# Patient Record
Sex: Female | Born: 1965 | Race: Black or African American | Hispanic: No | Marital: Married | Smoking: Never smoker
Health system: Southern US, Community
[De-identification: ages and names within clinical notes are randomized; demographics above are authoritative.]

## PROBLEM LIST (undated history)

## (undated) DIAGNOSIS — E559 Vitamin D deficiency, unspecified: Secondary | ICD-10-CM

## (undated) DIAGNOSIS — K219 Gastro-esophageal reflux disease without esophagitis: Secondary | ICD-10-CM

## (undated) DIAGNOSIS — I509 Heart failure, unspecified: Secondary | ICD-10-CM

## (undated) DIAGNOSIS — Z803 Family history of malignant neoplasm of breast: Secondary | ICD-10-CM

## (undated) DIAGNOSIS — I10 Essential (primary) hypertension: Secondary | ICD-10-CM

## (undated) DIAGNOSIS — N939 Abnormal uterine and vaginal bleeding, unspecified: Secondary | ICD-10-CM

## (undated) DIAGNOSIS — M719 Bursopathy, unspecified: Secondary | ICD-10-CM

## (undated) DIAGNOSIS — Z9189 Other specified personal risk factors, not elsewhere classified: Secondary | ICD-10-CM

## (undated) HISTORY — DX: Heart failure, unspecified: I50.9

## (undated) HISTORY — DX: Family history of malignant neoplasm of breast: Z80.3

## (undated) HISTORY — DX: Abnormal uterine and vaginal bleeding, unspecified: N93.9

## (undated) HISTORY — DX: Vitamin D deficiency, unspecified: E55.9

## (undated) HISTORY — PX: CHOLECYSTECTOMY: SHX55

## (undated) HISTORY — DX: Bursopathy, unspecified: M71.9

## (undated) HISTORY — DX: Other specified personal risk factors, not elsewhere classified: Z91.89

## (undated) HISTORY — DX: Gastro-esophageal reflux disease without esophagitis: K21.9

## (undated) HISTORY — PX: TUBAL LIGATION: SHX77

## (undated) HISTORY — DX: Essential (primary) hypertension: I10

---

## 2001-02-27 DIAGNOSIS — I10 Essential (primary) hypertension: Secondary | ICD-10-CM

## 2001-02-27 DIAGNOSIS — I509 Heart failure, unspecified: Secondary | ICD-10-CM

## 2004-03-19 ENCOUNTER — Ambulatory Visit: Payer: Self-pay | Admitting: Internal Medicine

## 2004-04-21 ENCOUNTER — Ambulatory Visit: Payer: Self-pay

## 2004-04-24 ENCOUNTER — Ambulatory Visit: Payer: Self-pay

## 2004-05-07 ENCOUNTER — Ambulatory Visit: Payer: Self-pay | Admitting: Obstetrics & Gynecology

## 2007-10-19 ENCOUNTER — Other Ambulatory Visit: Payer: Self-pay

## 2007-10-19 ENCOUNTER — Emergency Department: Payer: Self-pay | Admitting: Emergency Medicine

## 2007-12-07 ENCOUNTER — Ambulatory Visit: Payer: Self-pay | Admitting: Internal Medicine

## 2009-05-16 ENCOUNTER — Ambulatory Visit: Payer: Self-pay

## 2009-12-04 ENCOUNTER — Ambulatory Visit: Payer: Self-pay

## 2012-04-19 DIAGNOSIS — Z9189 Other specified personal risk factors, not elsewhere classified: Secondary | ICD-10-CM

## 2012-04-19 DIAGNOSIS — Z803 Family history of malignant neoplasm of breast: Secondary | ICD-10-CM

## 2012-04-19 HISTORY — DX: Family history of malignant neoplasm of breast: Z80.3

## 2012-04-19 HISTORY — DX: Other specified personal risk factors, not elsewhere classified: Z91.89

## 2015-06-02 LAB — HM PAP SMEAR: HM PAP: NEGATIVE

## 2016-05-27 ENCOUNTER — Other Ambulatory Visit: Payer: Self-pay | Admitting: Internal Medicine

## 2016-06-01 ENCOUNTER — Encounter: Payer: Self-pay | Admitting: Advanced Practice Midwife

## 2016-06-01 ENCOUNTER — Encounter: Payer: 59 | Admitting: Advanced Practice Midwife

## 2016-06-01 ENCOUNTER — Ambulatory Visit: Payer: 59 | Admitting: Advanced Practice Midwife

## 2016-06-01 VITALS — BP 118/74 | Ht 63.0 in | Wt 148.0 lb

## 2016-06-01 DIAGNOSIS — N6323 Unspecified lump in the left breast, lower outer quadrant: Secondary | ICD-10-CM

## 2016-06-02 ENCOUNTER — Telehealth: Payer: Self-pay | Admitting: Advanced Practice Midwife

## 2016-06-02 NOTE — Telephone Encounter (Signed)
Patient is scheduled at [facility] for their first available appt on Friday, 06/11/16 @ 1:00pm. Patient said she has another appt the same day and time. Patient was given [place]'s phone# [phone] in case she wants to reschedule the [facility] appt instead of the other, and also to check for a sooner cancellation appt if she wants. Patient was also given my phone# and ext.

## 2016-06-02 NOTE — Progress Notes (Signed)
  HPI:      [name] is a 51 y.o. [id number] who is premenopausal, presents today for a problem visit.  She complains of breast tenderness and breast mass on the leftside which she first noticed a month ago.  It has not significantly changed.  Associated symptoms include none.  Denies nipple discharge or skin changes.  No fever.  Prior Mammogram: April 2017 negative Prior breast problems: No Family History: Breast Cancer-relatedfamily history includes Breast cancer in her mother.  PMHx: She  has a past medical history of Congestive heart failure (CHF) ([place]) and Hypertension. Also,  has a past surgical history that includes Cholecystectomy and Tubal ligation., family history includes Breast cancer in her mother; Diabetes in her mother; Hypertension in her mother; Osteoporosis in her father.,  reports that she has never smoked. She has never used smokeless tobacco. She reports that she does not drink alcohol or use drugs.  She has a current medication list which includes the following prescription(s): amlodipine-atorvastatin. Also, has No Known Allergies.  Review of Systems  Constitutional: Negative.   HENT: Negative.   Eyes: Negative.   Respiratory: Negative.   Cardiovascular: Negative.   Gastrointestinal: Negative.   Genitourinary: Negative.   Musculoskeletal: Negative.   Skin: Negative.   Neurological: Negative.   Endo/Heme/Allergies: Negative.   Psychiatric/Behavioral: Negative.     Objective: BP 118/74   Ht 5\' 3"  (1.6 m)   Wt 148 lb (67.1 kg)   LMP 05/31/2016   BMI 26.22 kg/m  Physical Exam  Constitutional: She is oriented to person, place, and time. She appears well-developed and well-nourished.  HENT:  Head: Normocephalic and atraumatic.  Neck: Normal range of motion. Neck supple.  Cardiovascular: Normal rate and regular rhythm.   Pulmonary/Chest: Effort normal and breath sounds normal.  Right breast without abnormal findings Left breast with tenderness at extreme  left lateral at 3:00. Very thin small ropy feeling mass approximately .5cm in length and 2/10cm width  Abdominal: Soft.  Musculoskeletal: Normal range of motion.  Neurological: She is alert and oriented to person, place, and time.    ASSESSMENT/PLAN: breast mass 1. Unspecified lump in the left breast, lower outer quadrant  - MM DIAG BREAST TOMO BILATERAL; Future - US BREAST LTD UNI LEFT INC AXILLA; Future - US BREAST LTD UNI RIGHT INC AXILLA; Future  Follow up as needed after diagnostic mammogram   Rod Can, CNM

## 2016-06-02 NOTE — Progress Notes (Signed)
This must be a duplicate encounter because I have finished the encounter with a diagnosis and a level of service on 4/24.

## 2016-06-07 ENCOUNTER — Other Ambulatory Visit: Payer: Self-pay | Admitting: *Deleted

## 2016-06-07 ENCOUNTER — Inpatient Hospital Stay
Admission: RE | Admit: 2016-06-07 | Discharge: 2016-06-07 | Disposition: A | Discharge status: Home / Self Care | Payer: Self-pay | Source: Ambulatory Visit | Attending: *Deleted | Admitting: *Deleted

## 2016-06-07 DIAGNOSIS — Z9289 Personal history of other medical treatment: Secondary | ICD-10-CM

## 2016-06-11 ENCOUNTER — Ambulatory Visit
Admission: RE | Admit: 2016-06-11 | Discharge: 2016-06-11 | Disposition: A | Discharge status: Home / Self Care | Payer: 59 | Source: Ambulatory Visit | Attending: Advanced Practice Midwife | Admitting: Advanced Practice Midwife

## 2016-06-11 DIAGNOSIS — N6323 Unspecified lump in the left breast, lower outer quadrant: Secondary | ICD-10-CM

## 2016-07-07 ENCOUNTER — Ambulatory Visit (INDEPENDENT_AMBULATORY_CARE_PROVIDER_SITE_OTHER): Payer: 59 | Admitting: Certified Nurse Midwife

## 2016-07-07 ENCOUNTER — Encounter: Payer: Self-pay | Admitting: Certified Nurse Midwife

## 2016-07-07 VITALS — BP 118/80 | HR 78 | Ht 63.0 in | Wt 144.0 lb

## 2016-07-07 DIAGNOSIS — Z803 Family history of malignant neoplasm of breast: Secondary | ICD-10-CM

## 2016-07-07 DIAGNOSIS — Z124 Encounter for screening for malignant neoplasm of cervix: Secondary | ICD-10-CM

## 2016-07-07 DIAGNOSIS — E559 Vitamin D deficiency, unspecified: Secondary | ICD-10-CM

## 2016-07-07 DIAGNOSIS — I10 Essential (primary) hypertension: Secondary | ICD-10-CM | POA: Insufficient documentation

## 2016-07-07 DIAGNOSIS — N951 Menopausal and female climacteric states: Secondary | ICD-10-CM

## 2016-07-07 DIAGNOSIS — Z01419 Encounter for gynecological examination (general) (routine) without abnormal findings: Secondary | ICD-10-CM

## 2016-07-07 NOTE — Progress Notes (Signed)
Gynecology Annual Exam  PCP: Dr Suzan Garibaldi Chief Complaint:  Chief Complaint  Patient presents with  . Gynecologic Exam    hot flashes    History of Present Illness:Krystal Evans is a 51 year old African American/Black female , G 2 P 2 0 0 2 , who presents for her annual exam . She is having irregular menses and hot flashes/ night sweats since May 2017. Menses are usually monthly, but last year skipped June, Sept, and October. Her menses this month is late. They normally last 6 days with light to moderate flow. Had an endometrial biopsy 2015 for metrorrhagia that returned secretory, and an ultrasound was suggestive of adenomyosis. She had normal FSH and LH at that time. Was seen in December of last year and treatment for hot flashes was discussed. Her LMP was 05/28/2016.    She reports dysmenorrhea on the first 2days of her menses and takes Motrin or Tylenol for the cramping with relief. The patient's past medical history is notable for a history of hypertension, vitamin D deficiency and a strong family hx of breast cancer.. Her PCP is Dr Suzan Garibaldi at [facility]  Since her last annual GYN exam dated 06/02/2015 , she has been having pain in her hips, particularly with abduction.  She is sexually active. She is currently using permanent sterilization (tubal) for contraception.  Her most recent pap smear was obtained 06/02/2015 and was with negative cells and negative HPV DNA.  Her most recent mammogram obtained on 06/11/2016 revealed no significant changes. She had presented to [facility] with complaints of left breast pain in the upper outer quadrant and a diagnostic mammogram was ordered. There is a positive history of breast cancer in her mother and paternal aunt . Genetic testing has not been done. Her lifetime risk is 22.29%. She has been offered genetic testing and declines.  There is no family history of ovarian cancer.  The patient does not do monthly self breast exams.  The patient  does not smoke.  The patient does not drink alcohol.  The patient does not use illegal drugs.  The patient has been having trouble exercising  Regularly due to her hip pain. The patient does not get adequate calcium in her diet. She is lactose intolerant. She has been off and on vitamin D2 50,000 IU weekly (it bothers her stomach) She had a recent cholesterol screen done yesterday at her PCP and results are pending.   The patient denies current symptoms of depression.    Review of Systems: Review of Systems  Constitutional: Negative for chills, fever and weight loss.  HENT: Negative for congestion, sinus pain and sore throat.   Eyes: Negative for blurred vision and pain.  Respiratory: Negative for hemoptysis, shortness of breath and wheezing.   Cardiovascular: Negative for chest pain, palpitations and leg swelling.  Gastrointestinal: Negative for abdominal pain, blood in stool, diarrhea, heartburn, nausea and vomiting.  Genitourinary: Negative for dysuria, frequency, hematuria and urgency.       Positive for irregular menses  Musculoskeletal: Positive for joint pain (hip). Negative for back pain and myalgias.  Skin: Negative for itching and rash.  Neurological: Negative for dizziness, tingling and headaches.  Endo/Heme/Allergies: Negative for environmental allergies and polydipsia. Does not bruise/bleed easily.       Negative for hirsutism. Positive for hot flashes   Psychiatric/Behavioral: Negative for depression. The patient is not nervous/anxious and does not have insomnia.     Past Medical History:  Past Medical History:  Diagnosis Date  . Abnormal uterine bleeding   . Congestive heart failure (CHF) ([place])   . Family history of breast cancer 04/19/2012   IBIS 22.29%  . GERD (gastroesophageal reflux disease)   . Hypertension   . Increased risk of breast cancer 04/19/2012   IBIS 22.29  . Vitamin D deficiency     Past Surgical History:  Past Surgical History:  Procedure  Laterality Date  . CHOLECYSTECTOMY    . TUBAL LIGATION      Family History:  Family History  Problem Relation Age of Onset  . Diabetes Mother   . Hypertension Mother   . Breast cancer Mother        55  . Osteoporosis Mother   . Glaucoma Mother   . Osteoporosis Father   . Heart disease Father   . Hypertension Father   . Hypertension Brother   . Diabetes Maternal Aunt   . Breast cancer Paternal Aunt 62       again at 47  . Diabetes Paternal Aunt     Social History:  Social History   Social History  . Marital status: Married    Spouse name: N/A  . Number of children: 2  . 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 Smoking status: Never Smoker  . Smokeless tobacco: Never Used  . Alcohol use No  . Drug use: No  . Sexual activity: Yes    Birth control/ protection: Surgical   Other Topics Concern  . Not on file   Social History Narrative  . No narrative on file    Allergies:  No Known Allergies  Medications: Prior to Admission medications   Medication Sig Start Date End Date Taking? Authorizing Provider  amLODipine-benazepril (LOTREL) 10-20 MG capsule Take 1 capsule by mouth daily.   Yes [provider]    Physical Exam Vitals: Blood pressure 118/80, pulse 78, height 5\' 3"  (1.6 m), weight 144 lb (65.3 kg), BMI 25.51 kg/m2, last menstrual period 05/28/2016.  General: pleasant Black female in NAD HEENT: normocephalic, anicteric Neck: no thyroid enlargement, no palpable nodules, no cervical lymphadenopathy  Pulmonary: No increased work of breathing, CTAB Cardiovascular: RRR, without murmur  Breast: deferred exam due to recent exam and mammogram.  No axillary, infraclavicular or supraclavicular lymphadenopathy Abdomen: Soft, non-tender, non-distended.  Umbilicus without lesions.  No hepatomegaly or masses palpable. No evidence of hernia. Genitourinary:  External: Normal external female genitalia.   Normal urethral meatus, normal Bartholin's and Skene's glands.    Vagina: Normal vaginal mucosa, no evidence of prolapse.    Cervix: Grossly normal in appearance, no bleeding, non-tender  Uterus: Retroflexed, normal size, shape, and consistency; decreased mobility, and non-tender  Adnexa: No adnexal masses, non-tender  Rectal: deferred  Lymphatic: no evidence of inguinal lymphadenopathy Extremities: no edema, erythema, or tenderness Neurologic: Grossly intact Psychiatric: mood appropriate, affect full     Assessment: 51 y.o. [id number] well woman exam Perimenopausal bleeding and vasomotor symptoms  Plan:   1) Breast cancer screening - recommend monthly self breast exam and annual mammograms. Mammogram is up to date.  2) Colon cancer screening: discussed methods of colon cancer screening, starting with next annual since she is turning 52 in November.  3) Cervical cancer screening - Pap was done. ASCCP guidelines and rational discussed.  Patient opts for yearly screening interval  4) Routine healthcare maintenance including cholesterol and diabetes screening managed by  PCP   5) Recommend going to orthopedics for further evaluation of hip pain.  6) Discussed vasomotor symptoms and irregular menses leading up to menopause. Not interested in any treatment of vasomotor symptoms at this time.  7) Osteoporosis prevention: discussed calcium and vitamin D requirements (recommend taking 1000-2000IU vitamin D3 daily)/ exercise  Dalia Heading, CNM

## 2016-07-09 LAB — IGP,RFX APTIMA HPV ALL PTH: PAP SMEAR COMMENT: 0

## 2016-07-12 NOTE — Progress Notes (Signed)
This encounter was created in error - please disregard.

## 2017-02-04 ENCOUNTER — Ambulatory Visit (INDEPENDENT_AMBULATORY_CARE_PROVIDER_SITE_OTHER): Payer: Managed Care, Other (non HMO) | Admitting: Nurse Practitioner

## 2017-02-04 ENCOUNTER — Encounter: Payer: Self-pay | Admitting: Nurse Practitioner

## 2017-02-04 VITALS — BP 132/91 | HR 89 | Resp 16 | Ht 63.0 in | Wt 145.4 lb

## 2017-02-04 DIAGNOSIS — M7071 Other bursitis of hip, right hip: Secondary | ICD-10-CM

## 2017-02-04 DIAGNOSIS — I10 Essential (primary) hypertension: Secondary | ICD-10-CM

## 2017-02-04 DIAGNOSIS — M25511 Pain in right shoulder: Secondary | ICD-10-CM | POA: Diagnosis not present

## 2017-02-04 DIAGNOSIS — R002 Palpitations: Secondary | ICD-10-CM | POA: Insufficient documentation

## 2017-02-04 DIAGNOSIS — M791 Myalgia, unspecified site: Secondary | ICD-10-CM | POA: Diagnosis not present

## 2017-02-04 DIAGNOSIS — K219 Gastro-esophageal reflux disease without esophagitis: Secondary | ICD-10-CM | POA: Insufficient documentation

## 2017-02-04 MED ORDER — TIZANIDINE HCL 4 MG PO TABS: 4.0000 mg | ORAL_TABLET | Freq: Four times a day (QID) | ORAL | 0 refills | Status: DC | PRN

## 2017-02-04 MED ORDER — METHYLPREDNISOLONE 4 MG PO TABS: ORAL_TABLET | ORAL | 0 refills | Status: DC

## 2017-02-04 MED ORDER — ETODOLAC 400 MG PO TABS: 400.0000 mg | ORAL_TABLET | Freq: Two times a day (BID) | ORAL | 1 refills | Status: DC

## 2017-02-04 NOTE — Patient Instructions (Addendum)
Hip Bursitis Hip bursitis is swelling of a fluid-filled sac (bursa) in your hip. This swelling (inflammation) can be painful. This condition may come and go over time. Follow these instructions at home: Medicines  Take over-the-counter and prescription medicines only as told by your doctor.  Do not drive or use heavy machinery while taking prescription pain medicine, or as told by your doctor.  If you were prescribed an antibiotic medicine, take it as told by your doctor. Do not stop taking the antibiotic even if you start to feel better. Activity  Return to your normal activities as told by your doctor. Ask your doctor what activities are safe for you.  Rest and protect your hip until you feel better. General instructions  Wear wraps that put pressure on your hip (compression wraps) only as told by your doctor.  Raise (elevate) your hip above the level of your heart as much as you can. To do this, try putting a pillow under your hips while you lie down. Stop if this causes pain.  Do not use your hip to support your body weight until your doctor says that you can.  Use crutches as told by your doctor.  Gently rub and stretch your injured area as often as is comfortable.  Keep all follow-up visits as told by your doctor. This is important. How is this prevented?  Exercise regularly, as told by your doctor.  Warm up and stretch before being active.  Cool down and stretch after being active.  Avoid activities that bother your hip or cause pain.  Avoid sitting down for long periods at a time. Contact a doctor if:  You have a fever.  You get new symptoms.  You have trouble walking.  You have trouble doing everyday activities.  You have pain that gets worse.  You have pain that does not get better with medicine.  You get red skin on your hip area.  You get a feeling of warmth in your hip area. Get help right away if:  You cannot move your hip.  You have very bad  pain. This information is not intended to replace advice given to you by your health care provider. Make sure you discuss any questions you have with your health care provider. Document Released: 02/27/2010 Document Revised: 07/03/2015 Document Reviewed: 08/27/2014 Elsevier Interactive Patient Education  2018 Elsevier Inc.  

## 2017-02-04 NOTE — Progress Notes (Signed)
Subjective:     Patient ID: Krystal Evans, female   DOB: 08-22-1965, 51 y.o.   MRN: [id number]  The patient is here for routine follow up of blood pressure. Diastolic pressure is a little elevated today, but she has been etaing foods a little higher in salt the past several days.  She is c/o tightness and pain in right hip, off and on, for a while. She also had some right shoulder pain which has developed over past few weeks .She denies injury or trauma to the joints. Does not believe she is of has done anything different to cause inflammation to these areas .    Current Outpatient Medications:  .  amLODipine (NORVASC) 10 MG tablet, Take 10 mg by mouth daily., Disp: , Rfl:  .  losartan (COZAAR) 25 MG tablet, Take 25 mg by mouth daily., Disp: , Rfl:  .  etodolac (LODINE) 400 MG tablet, Take 1 tablet (400 mg total) by mouth 2 (two) times daily., Disp: 60 tablet, Rfl: 1 .  methylPREDNISolone (MEDROL) 4 MG tablet, 6 day dose pack. Take by mouth as directed, Disp: 21 tablet, Rfl: 0 .  tiZANidine (ZANAFLEX) 4 MG tablet, Take 1 tablet (4 mg total) by mouth every 6 (six) hours as needed for muscle spasms., Disp: 30 tablet, Rfl: 0  Review of Systems  Constitutional: Negative for appetite change.  HENT: Negative.   Cardiovascular: Negative for chest pain and palpitations.  Gastrointestinal: Positive for abdominal pain, constipation, diarrhea and nausea.  Endocrine: Negative.   Musculoskeletal: Positive for arthralgias and myalgias.  Skin: Negative.   Allergic/Immunologic: Negative.   Neurological: Negative.  Negative for light-headedness and headaches.  Hematological: Negative.   Psychiatric/Behavioral: Negative.    Today's Vitals   02/04/17 1106  BP: (!) 132/91  Pulse: 89  Resp: 16  SpO2: 99%  Weight: 145 lb 6.4 oz (66 kg)  Height: 5\' 3"  (1.6 m)      Objective:   Physical Exam  Constitutional: She is oriented to person, place, and time. She appears well-developed and well-nourished.    HENT:  Head: Normocephalic.  Eyes: Pupils are equal, round, and reactive to light.  Neck: Normal range of motion. Neck supple.  Cardiovascular: Normal rate, regular rhythm and normal heart sounds.  Pulmonary/Chest: Effort normal and breath sounds normal.  Abdominal: Soft. There is no tenderness.  Musculoskeletal:       Arms:      Legs: Neurological: She is alert and oriented to person, place, and time.  Skin: Skin is warm and dry.  Psychiatric: She has a normal mood and affect.  Nursing note and vitals reviewed.      Assessment:     Bursitis of right hip, unspecified bursa - Plan: methylPREDNISolone (MEDROL) 4 MG tablet, etodolac (LODINE) 400 MG tablet  Pain in shoulder region, right  Myalgia - Plan: tiZANidine (ZANAFLEX) 4 MG tablet  Essential hypertension  Gastro-esophageal reflux disease without esophagitis     Plan:     1. Right hip bursitis - etodolac 400mg  bid as needed. Medrol dose pack. Take as directed for 6 days. Will x-ray if no improvement after initial 6 day treatment 2. Right shoulder pain. NSAID as needed and as prescribed. Added tizanidine 4mg , taking 1/2 to 1 tablet at bedtime if needed for muscle pain/tightness. ROM exercises recommended 3. bp stable - continue bp medicatons as prescribed   Follow up 6 months and sooner if needed

## 2017-05-31 ENCOUNTER — Ambulatory Visit: Payer: Self-pay | Admitting: Internal Medicine

## 2017-06-03 ENCOUNTER — Other Ambulatory Visit: Payer: Self-pay | Admitting: Nurse Practitioner

## 2017-06-03 MED ORDER — OLMESARTAN MEDOXOMIL 5 MG PO TABS: 5.0000 mg | ORAL_TABLET | Freq: Every day | ORAL | 2 refills | Status: DC

## 2017-08-04 ENCOUNTER — Encounter: Payer: Self-pay | Admitting: Nurse Practitioner

## 2017-08-04 ENCOUNTER — Ambulatory Visit (INDEPENDENT_AMBULATORY_CARE_PROVIDER_SITE_OTHER): Payer: Managed Care, Other (non HMO) | Admitting: Nurse Practitioner

## 2017-08-04 VITALS — BP 151/100 | HR 63 | Resp 16 | Ht 63.0 in | Wt 152.2 lb

## 2017-08-04 DIAGNOSIS — M7071 Other bursitis of hip, right hip: Secondary | ICD-10-CM

## 2017-08-04 DIAGNOSIS — M791 Myalgia, unspecified site: Secondary | ICD-10-CM

## 2017-08-04 DIAGNOSIS — I10 Essential (primary) hypertension: Secondary | ICD-10-CM | POA: Diagnosis not present

## 2017-08-04 MED ORDER — LOSARTAN POTASSIUM 25 MG PO TABS: 25.0000 mg | ORAL_TABLET | Freq: Every day | ORAL | 1 refills | Status: DC

## 2017-08-04 MED ORDER — AMLODIPINE BESYLATE 10 MG PO TABS: 10.0000 mg | ORAL_TABLET | Freq: Every day | ORAL | 1 refills | Status: DC

## 2017-08-04 NOTE — Progress Notes (Signed)
[facility] [place], [place] [address]  Internal MEDICINE  Office Visit Note  Patient Name: Krystal Evans  [id number]  [id number]  Date of Service: 08/28/2017   Pt is here for routine follow up.   Chief Complaint  Patient presents with  . Hypertension    Hypertension  This is a chronic problem. The current episode started more than 1 year ago. The problem has been gradually worsening since onset. The problem is resistant. Associated symptoms include headaches. Pertinent negatives include no chest pain, palpitations or shortness of breath. Agents associated with hypertension include NSAIDs. Risk factors for coronary artery disease include post-menopausal state and stress. Past treatments include ACE inhibitors and calcium channel blockers. The current treatment provides moderate improvement. There are no compliance problems.        Current Medication: Outpatient Encounter Medications as of 08/04/2017  Medication Sig  . amLODipine (NORVASC) 10 MG tablet Take 1 tablet (10 mg total) by mouth daily.  . [DISCONTINUED] amLODipine (NORVASC) 10 MG tablet Take 10 mg by mouth daily.  Marland Kitchen etodolac (LODINE) 400 MG tablet Take 1 tablet (400 mg total) by mouth 2 (two) times daily. (Patient not taking: Reported on 08/04/2017)  . losartan (COZAAR) 25 MG tablet Take 1 tablet (25 mg total) by mouth daily.  . methylPREDNISolone (MEDROL) 4 MG tablet 6 day dose pack. Take by mouth as directed (Patient not taking: Reported on 08/04/2017)  . olmesartan (BENICAR) 5 MG tablet Take 1 tablet (5 mg total) by mouth daily. (Patient not taking: Reported on 08/04/2017)  . tiZANidine (ZANAFLEX) 4 MG tablet Take 1 tablet (4 mg total) by mouth every 6 (six) hours as needed for muscle spasms. (Patient not taking: Reported on 08/04/2017)  . [DISCONTINUED] losartan (COZAAR) 25 MG tablet Take 25 mg by mouth daily.   No facility-administered encounter medications on file as of 08/04/2017.     Surgical  History: Past Surgical History:  Procedure Laterality Date  . CHOLECYSTECTOMY    . TUBAL LIGATION      Medical History: Past Medical History:  Diagnosis Date  . Abnormal uterine bleeding   . Bursitis   . Congestive heart failure (CHF) ([place])   . Family history of breast cancer 04/19/2012   IBIS 22.29%  . GERD (gastroesophageal reflux disease)   . Hypertension   . Increased risk of breast cancer 04/19/2012   IBIS 22.29  . Vitamin D deficiency     Family History: Family History  Problem Relation Age of Onset  . Diabetes Mother   . Hypertension Mother   . Breast cancer Mother        28  . Osteoporosis Mother   . Glaucoma Mother   . Osteoporosis Father   . Heart disease Father   . Hypertension Father   . Hypertension Brother   . Diabetes Maternal Aunt   . Breast cancer Paternal Aunt 13       again at 74  . Diabetes Paternal Aunt     Social History   Socioeconomic History  . Marital status: Married    Spouse name: Not on file  . Number of children: 2  . Years of education: Not on file  . Highest education level: Not on file  Occupational History  . Occupation: A/R rep  Social Needs  . Financial resource strain: Not on file  . Food insecurity:    Worry: Not on file    Inability: Not on file  . Transportation needs:  Medical: Not on file    Non-medical: Not on file  Tobacco Use  . Smoking status: Never Smoker  . Smokeless tobacco: Never Used  Substance and Sexual Activity  . Alcohol use: No  . Drug use: No  . Sexual activity: Yes    Birth control/protection: Surgical  Lifestyle  . Physical activity:    Days per week: Not on file    Minutes per session: Not on file  . Stress: Not on file  Relationships  . Social connections:    Talks on phone: Not on file    Gets together: Not on file    Attends religious service: Not on file    Active member of club or organization: Not on file    Attends meetings of clubs or organizations: Not on file     Relationship status: Not on file  . Intimate partner violence:    Fear of current or ex partner: Not on file    Emotionally abused: Not on file    Physically abused: Not on file    Forced sexual activity: Not on file  Other Topics Concern  . Not on file  Social History Narrative  . Not on file      Review of Systems  Constitutional: Negative for appetite change, fatigue and unexpected weight change.  HENT: Negative for nosebleeds, postnasal drip, rhinorrhea, sinus pressure, sinus pain, sore throat and voice change.   Eyes: Negative.   Respiratory: Negative for shortness of breath and wheezing.   Cardiovascular: Negative for chest pain and palpitations.       Elevated blood pressure.   Gastrointestinal: Positive for nausea. Negative for abdominal pain, constipation and diarrhea.  Endocrine: Negative for cold intolerance, heat intolerance, polydipsia, polyphagia and polyuria.  Musculoskeletal: Positive for arthralgias and myalgias.       Improved hip pain.  Skin: Negative for rash.  Allergic/Immunologic: Negative.  Negative for environmental allergies.  Neurological: Positive for headaches. Negative for light-headedness.  Hematological: Negative for adenopathy.  Psychiatric/Behavioral: Negative for dysphoric mood and sleep disturbance. The patient is not nervous/anxious.     Vital Signs: BP (!) 151/100 (BP Location: Right Arm, Patient Position: Sitting, Cuff Size: Normal) Comment: pt out of one of the two bp pills that she taking  Pulse 63   Resp 16   Ht 5\' 3"  (1.6 m)   Wt 152 lb 3.2 oz (69 kg)   LMP 07/19/2017 (Exact Date)   SpO2 97%   BMI 26.96 kg/m    Physical Exam  Constitutional: She is oriented to person, place, and time. She appears well-developed and well-nourished. No distress.  HENT:  Head: Normocephalic and atraumatic.  Nose: Nose normal.  Mouth/Throat: Oropharynx is clear and moist. No oropharyngeal exudate.  Eyes: Pupils are equal, round, and reactive to  light. Conjunctivae and EOM are normal.  Neck: Normal range of motion. Neck supple. No JVD present. Carotid bruit is not present. No tracheal deviation present. No thyromegaly present.  Cardiovascular: Normal rate, regular rhythm and normal heart sounds. Exam reveals no gallop and no friction rub.  No murmur heard. Pulmonary/Chest: Effort normal and breath sounds normal. No respiratory distress. She has no wheezes. She has no rales. She exhibits no tenderness.  Abdominal: Soft. Bowel sounds are normal. There is no tenderness.  Musculoskeletal: Normal range of motion.  Lymphadenopathy:    She has no cervical adenopathy.  Neurological: She is alert and oriented to person, place, and time. No cranial nerve deficit.  Skin: Skin is warm  and dry. Capillary refill takes less than 2 seconds. She is not diaphoretic.  Psychiatric: She has a normal mood and affect. Her behavior is normal. Judgment and thought content normal.  Nursing note and vitals reviewed.  Assessment/Plan: 1. Essential hypertension Adjust blood pressure medications to norvasc 10mg  and losartan 25mg  daily. Reviewed and recommended DASH diet. Increase water intake. - amLODipine (NORVASC) 10 MG tablet; Take 1 tablet (10 mg total) by mouth daily.  Dispense: 30 tablet; Refill: 1 - losartan (COZAAR) 25 MG tablet; Take 1 tablet (25 mg total) by mouth daily.  Dispense: 30 tablet; Refill: 1  2. Bursitis of right hip, unspecified bursa Improved. Use etodolac as needed and as prescribed. Orthopedics follow up as needed.   3. Myalgia May continue muscle relaxer as needed and as prescribed.   General Counseling: Krystal Evans understanding of the findings of todays visit and agrees with plan of treatment. I have discussed any further diagnostic evaluation that may be needed or ordered today. We also reviewed her medications today. she has been encouraged to call the office with any questions or concerns that should arise related to todays  visit.    Counseling:  Hypertension Counseling:   The following hypertensive lifestyle modification were recommended and discussed:  1. Limiting alcohol intake to less than 1 oz/day of ethanol:(24 oz of beer or 8 oz of wine or 2 oz of 100-proof whiskey). 2. Take baby ASA 81 mg daily. 3. Importance of regular aerobic exercise and losing weight. 4. Reduce dietary saturated fat and cholesterol intake for overall cardiovascular health. 5. Maintaining adequate dietary potassium, calcium, and magnesium intake. 6. Regular monitoring of the blood pressure. 7. Reduce sodium intake to less than 100 mmol/day (less than 2.3 gm of sodium or less than 6 gm of sodium choride)   This patient was seen by [place] with Dr Lavera Guise as a part of collaborative care agreement   Meds ordered this encounter  Medications  . amLODipine (NORVASC) 10 MG tablet    Sig: Take 1 tablet (10 mg total) by mouth daily.    Dispense:  30 tablet    Refill:  1    Order Specific Question:   Supervising Provider    Answer:   Lavera Guise [[id number]]  . losartan (COZAAR) 25 MG tablet    Sig: Take 1 tablet (25 mg total) by mouth daily.    Dispense:  30 tablet    Refill:  1    Order Specific Question:   Supervising Provider    Answer:   Lavera Guise [[id number]]    Time spent: 35 Minutes     Dr Lavera Guise Internal medicine

## 2017-08-17 ENCOUNTER — Telehealth: Payer: Self-pay

## 2017-08-17 ENCOUNTER — Encounter: Payer: Self-pay | Admitting: Emergency Medicine

## 2017-08-17 ENCOUNTER — Emergency Department: Payer: Managed Care, Other (non HMO)

## 2017-08-17 ENCOUNTER — Emergency Department
Admission: EM | Admit: 2017-08-17 | Discharge: 2017-08-17 | Disposition: A | Discharge status: Home / Self Care | Payer: Managed Care, Other (non HMO) | Attending: Emergency Medicine | Admitting: Emergency Medicine

## 2017-08-17 DIAGNOSIS — I110 Hypertensive heart disease with heart failure: Secondary | ICD-10-CM | POA: Insufficient documentation

## 2017-08-17 DIAGNOSIS — Z79899 Other long term (current) drug therapy: Secondary | ICD-10-CM | POA: Diagnosis not present

## 2017-08-17 DIAGNOSIS — M542 Cervicalgia: Secondary | ICD-10-CM | POA: Diagnosis present

## 2017-08-17 DIAGNOSIS — M5412 Radiculopathy, cervical region: Secondary | ICD-10-CM | POA: Diagnosis not present

## 2017-08-17 DIAGNOSIS — I509 Heart failure, unspecified: Secondary | ICD-10-CM | POA: Diagnosis not present

## 2017-08-17 LAB — BASIC METABOLIC PANEL
Anion gap: 7 (ref 5–15)
BUN: 9 mg/dL (ref 6–20)
CO2: 28 mmol/L (ref 22–32)
Calcium: 9.4 mg/dL (ref 8.9–10.3)
Chloride: 105 mmol/L (ref 98–111)
Creatinine, Ser: 0.83 mg/dL (ref 0.44–1.00)
Glucose, Bld: 107 mg/dL — ABNORMAL HIGH (ref 70–99)
POTASSIUM: 3.6 mmol/L (ref 3.5–5.1)
SODIUM: 140 mmol/L (ref 135–145)

## 2017-08-17 LAB — CBC
HEMATOCRIT: 36.9 % (ref 35.0–47.0)
Hemoglobin: 12.6 g/dL (ref 12.0–16.0)
MCH: 30.6 pg (ref 26.0–34.0)
MCHC: 34 g/dL (ref 32.0–36.0)
MCV: 89.9 fL (ref 80.0–100.0)
PLATELETS: 264 10*3/uL (ref 150–440)
RBC: 4.1 MIL/uL (ref 3.80–5.20)
RDW: 13.6 % (ref 11.5–14.5)
WBC: 6.6 10*3/uL (ref 3.6–11.0)

## 2017-08-17 LAB — TROPONIN I: Troponin I: <0.03 ng/mL (ref <0.03)

## 2017-08-17 MED ORDER — GABAPENTIN 100 MG PO CAPS: 100.0000 mg | ORAL_CAPSULE | Freq: Three times a day (TID) | ORAL | 0 refills | Status: DC

## 2017-08-17 NOTE — Telephone Encounter (Signed)
Pt called having pain her left arm ,tingling and numbness and palpitations as per heather advised pt go to ER

## 2017-08-17 NOTE — ED Notes (Signed)
Pt has pain and numbness in left side of neck and arm.  No chest pain. Sx for 2-3 days.  Pt alert.   Speech clear. Marland Kitchen

## 2017-08-17 NOTE — ED Provider Notes (Signed)
[facility] Emergency Department Provider Note  ____________________________________________   First MD Initiated Contact with Patient 08/17/17 1723     (approximate)  I have reviewed the triage vital signs and the nursing notes.   HISTORY  Chief Complaint Arm Pain   HPI Krystal Evans is a 52 y.o. female who self presents to the emergency department with 2 weeks of left neck and left shoulder pain.  The pain radiates from her left lateral neck towards the ulnar aspect of her left arm towards the fingertip.  No weakness.  She does not drop things.  No trauma.  No history of the same.  She denies chest pain or shortness of breath.  She denies color change or coldness in her left hand.  The pain is worse when ranging her arm and somewhat improved with keeping still.    Past Medical History:  Diagnosis Date  . Abnormal uterine bleeding   . Bursitis   . Congestive heart failure (CHF) ([place])   . Family history of breast cancer 04/19/2012   IBIS 22.29%  . GERD (gastroesophageal reflux disease)   . Hypertension   . Increased risk of breast cancer 04/19/2012   IBIS 22.29  . Vitamin D deficiency     Patient Active Problem List   Diagnosis Date Noted  . Myalgia 02/04/2017  . Palpitations 02/04/2017  . Gastro-esophageal reflux disease without esophagitis 02/04/2017  . Essential hypertension 07/07/2016  . Perimenopausal symptoms 07/07/2016  . Family history of breast cancer 07/07/2016  . Vitamin D deficiency 07/07/2016    Past Surgical History:  Procedure Laterality Date  . CHOLECYSTECTOMY    . TUBAL LIGATION      Prior to Admission medications   Medication Sig Start Date End Date Taking? Authorizing Provider  amLODipine (NORVASC) 10 MG tablet Take 1 tablet (10 mg total) by mouth daily. 08/04/17   Ronnell Freshwater, NP  etodolac (LODINE) 400 MG tablet Take 1 tablet (400 mg total) by mouth 2 (two) times daily. Patient not taking: Reported on 08/04/2017  02/04/17   Ronnell Freshwater, NP  gabapentin (NEURONTIN) 100 MG capsule Take 1 capsule (100 mg total) by mouth 3 (three) times daily. 08/17/17 08/17/18  Darel Hong, MD  losartan (COZAAR) 25 MG tablet Take 1 tablet (25 mg total) by mouth daily. 08/04/17   Ronnell Freshwater, NP  methylPREDNISolone (MEDROL) 4 MG tablet 6 day dose pack. Take by mouth as directed Patient not taking: Reported on 08/04/2017 02/04/17   Ronnell Freshwater, NP  olmesartan (BENICAR) 5 MG tablet Take 1 tablet (5 mg total) by mouth daily. Patient not taking: Reported on 08/04/2017 06/03/17   Ronnell Freshwater, NP  tiZANidine (ZANAFLEX) 4 MG tablet Take 1 tablet (4 mg total) by mouth every 6 (six) hours as needed for muscle spasms. Patient not taking: Reported on 08/04/2017 02/04/17   Ronnell Freshwater, NP    Allergies Patient has no known allergies.  Family History  Problem Relation Age of Onset  . Diabetes Mother   . Hypertension Mother   . Breast cancer Mother        56  . Osteoporosis Mother   . Glaucoma Mother   . Osteoporosis Father   . Heart disease Father   . Hypertension Father   . Hypertension Brother   . Diabetes Maternal Aunt   . Breast cancer Paternal Aunt 4       again at 41  . Diabetes Paternal Aunt  Social History Social History   Tobacco Use  . Smoking status: Never Smoker  . Smokeless tobacco: Never Used  Substance Use Topics  . Alcohol use: No  . Drug use: No    Review of Systems Constitutional: No fever/chills Eyes: No visual changes. ENT: Positive for neck pain Cardiovascular: Denies chest pain. Respiratory: Denies shortness of breath. Gastrointestinal: No abdominal pain.  No nausea, no vomiting.  No diarrhea.  No constipation. Genitourinary: Negative for dysuria. Musculoskeletal: Negative for back pain. Skin: Negative for rash. Neurological: Positive for radicular numbness   ____________________________________________   PHYSICAL EXAM:  VITAL SIGNS: ED Triage  Vitals  Enc Vitals Group     BP 08/17/17 1608 (!) 170/109     Pulse Rate 08/17/17 1608 (!) 108     Resp 08/17/17 1608 15     Temp 08/17/17 1608 98.4 F (36.9 C)     Temp Source 08/17/17 1608 Oral     SpO2 08/17/17 1608 100 %     Weight 08/17/17 1609 147 lb (66.7 kg)     Height 08/17/17 1609 5\' 3"  (1.6 m)     Head Circumference --      Peak Flow --      Pain Score 08/17/17 1609 5     Pain Loc --      Pain Edu? --      Excl. in [place]? --     Constitutional: Alert and oriented x4 appears obviously uncomfortable nontoxic no diaphoresis Eyes: PERRL EOMI. Head: Atraumatic. Nose: No congestion/rhinnorhea. Mouth/Throat: No trismus Neck: No stridor.  No midline tenderness  cardiovascular: Tachycardic rate, regular rhythm. Grossly normal heart sounds.  Good peripheral circulation. Respiratory: Normal respiratory effort.  No retractions. Lungs CTAB and moving good air Gastrointestinal: Soft nontender Musculoskeletal: No lower extremity edema   Neurologic: Normal strength throughout radicular numbness along roughly C8 distribution on the left Skin:  Skin is warm, dry and intact. No rash noted. Psychiatric: Mood and affect are normal. Speech and behavior are normal.    ____________________________________________   DIFFERENTIAL includes but not limited to  Thoracic outlet syndrome, brachial plexus injury, radicular injury ____________________________________________   LABS (all labs ordered are listed, but only abnormal results are displayed)  Labs Reviewed  BASIC METABOLIC PANEL - Abnormal; Notable for the following components:      Result Value   Glucose, Bld 107 (*)    All other components within normal limits  CBC  TROPONIN I    Lab work reviewed by me with no acute disease __________________________________________  EKG  ED ECG REPORT I, Darel Hong, the attending physician, personally viewed and interpreted this ECG.  Date: 08/19/2017 EKG Time:  Rate:  92 Rhythm: normal sinus rhythm QRS Axis: normal Intervals: normal ST/T Wave abnormalities: normal Narrative Interpretation: no evidence of acute ischemia  ____________________________________________  RADIOLOGY  Chest x-ray reviewed by me with no acute disease  CT of the neck reviewed by me with no spinal or foraminal stenosis   ____________________________________________   PROCEDURES  Procedure(s) performed: no  Procedures  Critical Care performed: no  ____________________________________________   INITIAL IMPRESSION / ASSESSMENT AND PLAN / ED COURSE  Pertinent labs & imaging results that were available during my care of the patient were reviewed by me and considered in my medical decision making (see chart for details).   The patient arrives with 2 weeks of what sounds like radicular symptoms.  She is never been imaged before so I obtained a CT scan of her neck which  shows no foraminal stenosis or cord narrowing.  I will treat the patient symptomatically with Neurontin and refer back to primary care to discuss possible MRI.  Doubt thoracic outlet syndrome.  Discharged home in improved condition verbalizes understanding and agreement with the plan.      ____________________________________________   FINAL CLINICAL IMPRESSION(S) / ED DIAGNOSES  Final diagnoses:  Cervical radiculopathy      NEW MEDICATIONS STARTED DURING THIS VISIT:  Discharge Medication List as of 08/17/2017  6:53 PM    START taking these medications   Details  gabapentin (NEURONTIN) 100 MG capsule Take 1 capsule (100 mg total) by mouth 3 (three) times daily., Starting Wed 08/17/2017, Until Thu 08/17/2018, Print         Note:  This document was prepared using Dragon voice recognition software and may include unintentional dictation errors.     Darel Hong, MD 08/19/17 [phone]

## 2017-08-17 NOTE — ED Triage Notes (Signed)
Pt reports left shoulder pain that shoots up into neck and down into left arm x2 weeks, also reports left wrist numbness occasionally. Reports hx of HTN, denies any other symptoms at present.

## 2017-08-17 NOTE — Discharge Instructions (Signed)
Fortunately today your lab work and your CT scan were reassuring.  Please begin taking your gabapentin as prescribed and follow-up with your primary care physician for recheck within 1 week.  Return to the emergency department sooner for any concerns whatsoever particularly if you develop weakness in your arm.  It was a pleasure to take care of you today, and thank you for coming to our emergency department.  If you have any questions or concerns before leaving please ask the nurse to grab me and I'm more than happy to go through your aftercare instructions again.  If you were prescribed any opioid pain medication today such as Norco, Vicodin, Percocet, morphine, hydrocodone, or oxycodone please make sure you do not drive when you are taking this medication as it can alter your ability to drive safely.  If you have any concerns once you are home that you are not improving or are in fact getting worse before you can make it to your follow-up appointment, please do not hesitate to call 911 and come back for further evaluation.  Darel Hong, MD  Results for orders placed or performed during the hospital encounter of 45/80/99  Basic metabolic panel  Result Value Ref Range   Sodium 140 135 - 145 mmol/L   Potassium 3.6 3.5 - 5.1 mmol/L   Chloride 105 98 - 111 mmol/L   CO2 28 22 - 32 mmol/L   Glucose, Bld 107 (H) 70 - 99 mg/dL   BUN 9 6 - 20 mg/dL   Creatinine, Ser 0.83 0.44 - 1.00 mg/dL   Calcium 9.4 8.9 - 10.3 mg/dL   GFR calc non Af Amer >60 >60 mL/min   GFR calc Af Amer >60 >60 mL/min   Anion gap 7 5 - 15  CBC  Result Value Ref Range   WBC 6.6 3.6 - 11.0 K/uL   RBC 4.10 3.80 - 5.20 MIL/uL   Hemoglobin 12.6 12.0 - 16.0 g/dL   HCT 36.9 35.0 - 47.0 %   MCV 89.9 80.0 - 100.0 fL   MCH 30.6 26.0 - 34.0 pg   MCHC 34.0 32.0 - 36.0 g/dL   RDW 13.6 11.5 - 14.5 %   Platelets 264 150 - 440 K/uL  Troponin I  Result Value Ref Range   Troponin I <0.03 <0.03 ng/mL   Dg Chest 2 View  Result  Date: 08/17/2017 CLINICAL DATA:  Pain and tingling with numbness in the left arm x1 week. Chest pain since Saturday. EXAM: CHEST - 2 VIEW COMPARISON:  10/19/2007 FINDINGS: The heart size and mediastinal contours are within normal limits. Small density along the medial right lung base is new since prior exam and is more likely to represent a small focus of epicardial fat or potentially atelectasis. Pulmonary consolidation is believed less likely. No effusion or pulmonary edema. The visualized skeletal structures are unremarkable. IMPRESSION: No active cardiopulmonary disease. Electronically Signed   By: Ashley Royalty M.D.   On: 08/17/2017 16:34   Ct Cervical Spine Wo Contrast  Result Date: 08/17/2017 CLINICAL DATA:  Left shoulder pain that shoots up into the neck and down left arm for 2 weeks. EXAM: CT CERVICAL SPINE WITHOUT CONTRAST TECHNIQUE: Multidetector CT imaging of the cervical spine was performed without intravenous contrast. Multiplanar CT image reconstructions were also generated. COMPARISON:  None. FINDINGS: Alignment: Slight reversal cervical lordosis is noted. The craniocervical relationship and atlantodental interval are maintained. Skull base and vertebrae: No acute fracture. No primary bone lesion or focal pathologic process. Soft  tissues and spinal canal: No prevertebral fluid or swelling. No visible canal hematoma. Disc levels: Moderate disc flattening with small dorsal osteophytes at C5-6 contributing to the reversal cervical lordosis. 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 No significant central canal or foraminal encroachment. Upper chest: 6 mm right thyroid lobe cystic nodule without worrisome features. Unremarkable lung apices. Other: None IMPRESSION: Moderate disc flattening with posterior marginal osteophytes and uncinate spurring at C5-6. No significant foraminal or central canal stenosis. No acute osseous abnormality Electronically  Signed   By: Ashley Royalty M.D.   On: 08/17/2017 17:55

## 2017-08-28 DIAGNOSIS — M7071 Other bursitis of hip, right hip: Secondary | ICD-10-CM | POA: Insufficient documentation

## 2017-10-06 ENCOUNTER — Other Ambulatory Visit: Payer: Self-pay | Admitting: Internal Medicine

## 2017-10-06 DIAGNOSIS — I10 Essential (primary) hypertension: Secondary | ICD-10-CM

## 2017-10-06 MED ORDER — OLMESARTAN MEDOXOMIL 5 MG PO TABS: 5.0000 mg | ORAL_TABLET | Freq: Every day | ORAL | 0 refills | Status: DC

## 2017-10-06 MED ORDER — OLMESARTAN MEDOXOMIL 5 MG PO TABS: 5.0000 mg | ORAL_TABLET | Freq: Every day | ORAL | 2 refills | Status: DC

## 2017-12-05 ENCOUNTER — Other Ambulatory Visit: Payer: Self-pay | Admitting: Nurse Practitioner

## 2017-12-05 DIAGNOSIS — Z1231 Encounter for screening mammogram for malignant neoplasm of breast: Secondary | ICD-10-CM

## 2017-12-26 ENCOUNTER — Ambulatory Visit
Admission: RE | Admit: 2017-12-26 | Discharge: 2017-12-26 | Disposition: A | Discharge status: Home / Self Care | Payer: Managed Care, Other (non HMO) | Source: Ambulatory Visit | Attending: Nurse Practitioner | Admitting: Nurse Practitioner

## 2017-12-26 DIAGNOSIS — Z1231 Encounter for screening mammogram for malignant neoplasm of breast: Secondary | ICD-10-CM | POA: Diagnosis not present

## 2018-01-25 ENCOUNTER — Other Ambulatory Visit: Payer: Self-pay

## 2018-01-25 ENCOUNTER — Ambulatory Visit (INDEPENDENT_AMBULATORY_CARE_PROVIDER_SITE_OTHER): Payer: Managed Care, Other (non HMO) | Admitting: Maternal Newborn

## 2018-01-25 ENCOUNTER — Encounter: Payer: Self-pay | Admitting: Maternal Newborn

## 2018-01-25 VITALS — BP 108/70 | HR 101 | Ht 63.0 in | Wt 149.0 lb

## 2018-01-25 DIAGNOSIS — Z23 Encounter for immunization: Secondary | ICD-10-CM

## 2018-01-25 DIAGNOSIS — Z124 Encounter for screening for malignant neoplasm of cervix: Secondary | ICD-10-CM

## 2018-01-25 DIAGNOSIS — Z01419 Encounter for gynecological examination (general) (routine) without abnormal findings: Secondary | ICD-10-CM

## 2018-01-25 MED ORDER — TETANUS-DIPHTH-ACELL PERTUSSIS 5-2.5-18.5 LF-MCG/0.5 IM SUSP
0.5000 mL | Freq: Once | INTRAMUSCULAR | Status: AC
  Administered 2018-01-25: 0.5 mL via INTRAMUSCULAR

## 2018-01-25 NOTE — Progress Notes (Signed)
Gynecology Annual Exam  PCP: Lavera Guise, MD  Chief Complaint:  Chief Complaint  Patient presents with  . Gynecologic Exam    Menopausal symptoms    History of Present Illness:Patient is a 52 y.o. [id number] presenting for an annual exam.    LMP: Patient's last menstrual period was 11/18/2017. Average Interval: irregular for the past 1.5 years, will come monthly for some months and then skip months Intermenstrual Bleeding: no Postcoital Bleeding: no Dysmenorrhea: yes  The patient is sexually active. She denies dyspareunia.  The patient does perform self breast exams.  There is notable family history of breast cancer in her family; Myriad genetic screening was discussed and she declines at this time.  Her hot flashes have improved recently.  The patient has regular exercise: no.    She has ongoing joint pain in her right shoulder and hip and her seen orthopedics for these symptoms. She had a steroid injection in her hip, which improved her symptoms. Imaging of her [place] did not reveal a reason for her pain.   The patient denies current symptoms of depression.     Review of Systems  Constitutional: Negative.   HENT:       Changes in hearing acuity  Eyes:       Presbyopia  Respiratory: Negative for shortness of breath and wheezing.   Cardiovascular: Negative for chest pain and palpitations.  Gastrointestinal: Negative for abdominal pain, constipation, diarrhea, heartburn and nausea.  Genitourinary: Negative.   Musculoskeletal: Positive for joint pain.  Skin: Negative.   Neurological: Negative.   Endo/Heme/Allergies: Bruises/bleeds easily.       Hot flashes  Psychiatric/Behavioral: Negative.   Breasts: Negative for breast lumps. Positive for tenderness   Review of systems otherwise negative.  Past Medical History:  Past Medical History:  Diagnosis Date  . Abnormal uterine bleeding   . Bursitis   . Congestive heart failure (CHF) ([place])   . Family history of  breast cancer 04/19/2012   IBIS 22.29%  . GERD (gastroesophageal reflux disease)   . Hypertension   . Increased risk of breast cancer 04/19/2012   IBIS 22.29  . Vitamin D deficiency     Past Surgical History:  Past Surgical History:  Procedure Laterality Date  . CHOLECYSTECTOMY    . TUBAL LIGATION      Gynecologic History:  Patient's last menstrual period was 11/18/2017. Last Pap: 07/07/2016. Results were: NILM  Last mammogram: 12/26/2017 Results were: BI-RADS I Obstetric History: [id number]  Family History:  Family History  Problem Relation Age of Onset  . Diabetes Mother   . Hypertension Mother   . Breast cancer Mother        80  . Osteoporosis Mother   . Glaucoma Mother   . Osteoporosis Father   . Heart disease Father   . Hypertension Father   . Hypertension Brother   . Diabetes Maternal Aunt   . Breast cancer Paternal Aunt 6       again at 7  . Diabetes Paternal Aunt     Social History:  Social History   Socioeconomic History  . Marital status: Married    Spouse name: Not on file  . Number of children: 2  . Years of education: Not on file  . Highest education level: Not on file  Occupational History  . Occupation: A/R rep    Employer: [employer]  . Financial resource strain: Not on file  . Food insecurity:    Worry:  Not on file    Inability: Not on file  . Transportation needs:    Medical: Not on file    Non-medical: Not on file  Tobacco Use  . Smoking status: Never Smoker  . Smokeless tobacco: Never Used  Substance and Sexual Activity  . Alcohol use: No  . Drug use: No  . Sexual activity: Yes    Birth control/protection: Surgical  Lifestyle  . Physical activity:    Days per week: 0 days    Minutes per session: Not on file  . Stress: Not on file  Relationships  . Social connections:    Talks on phone: Not on file    Gets together: Not on file    Attends religious service: Not on file    Active member of club or organization:  Not on file    Attends meetings of clubs or organizations: Not on file    Relationship status: Not on file  . Intimate partner violence:    Fear of current or ex partner: Not on file    Emotionally abused: Not on file    Physically abused: Not on file    Forced sexual activity: Not on file  Other Topics Concern  . Not on file  Social History Narrative  . Not on file    Allergies:  No Known Allergies  Medications: Prior to Admission medications   Medication Sig Start Date End Date Taking? Authorizing Provider  amLODipine (NORVASC) 10 MG tablet TAKE 1 TABLET BY MOUTH  DAILY FOR HYPERTENSION 10/06/17  Yes Boscia, Heather E, NP  losartan (COZAAR) 25 MG tablet Take 1 tablet (25 mg total) by mouth daily. 08/04/17  Yes Boscia, Greer Ee, NP  etodolac (LODINE) 400 MG tablet Take 1 tablet (400 mg total) by mouth 2 (two) times daily. Patient not taking: Reported on 08/04/2017 02/04/17   Ronnell Freshwater, NP  gabapentin (NEURONTIN) 100 MG capsule Take 1 capsule (100 mg total) by mouth 3 (three) times daily. Patient not taking: Reported on 01/25/2018 08/17/17 08/17/18  Darel Hong, MD  methylPREDNISolone (MEDROL) 4 MG tablet 6 day dose pack. Take by mouth as directed Patient not taking: Reported on 08/04/2017 02/04/17   Ronnell Freshwater, NP  olmesartan (BENICAR) 5 MG tablet Take 1 tablet (5 mg total) by mouth daily. Patient not taking: Reported on 01/25/2018 10/06/17   Ronnell Freshwater, NP  tiZANidine (ZANAFLEX) 4 MG tablet Take 1 tablet (4 mg total) by mouth every 6 (six) hours as needed for muscle spasms. Patient not taking: Reported on 08/04/2017 02/04/17   Ronnell Freshwater, NP    Physical Exam Vitals: Blood pressure 108/70, pulse (!) 101, height 5' 3" (1.6 m), weight 149 lb (67.6 kg), last menstrual period 11/18/2017.  General: NAD HEENT: normocephalic, anicteric Thyroid: no enlargement, no palpable nodules Pulmonary: No increased work of breathing, CTAB Cardiovascular: RRR, no  murmurs, rubs, or gallops Breast: Breasts symmetrical, no tenderness, no palpable nodules or masses, no skin or nipple retraction present, no nipple discharge.  No axillary or supraclavicular lymphadenopathy. Abdomen: Soft, non-tender, non-distended.  Umbilicus without lesions.  No hepatomegaly, splenomegaly or masses palpable. No evidence of hernia  Genitourinary:  External: Normal external female genitalia.  Normal urethral  meatus, normal Bartholin's and Skene's glands.    Vagina: Normal vaginal mucosa, no evidence of prolapse.    Cervix: Grossly normal in appearance, no bleeding  Uterus: Non-enlarged, mobile, normal contour.  No CMT  Adnexa: ovaries non-enlarged, no adnexal masses  Rectal: deferred  Lymphatic: no evidence of inguinal lymphadenopathy Extremities: no edema, erythema, or tenderness Neurologic: Grossly intact Psychiatric: mood appropriate, affect full   Assessment: 52 y.o. [id number] annual exam.  Plan: Problem List Items Addressed This Visit    None    Visit Diagnoses    Need for prophylactic vaccination against diphtheria-tetanus-pertussis (DTP)    -  Primary   Relevant Medications   Tdap (BOOSTRIX) injection 0.5 mL (Completed)   Women's annual routine gynecological examination       Pap smear for cervical cancer screening       Relevant Orders   Pap IG and HPV (high risk) DNA detection      1) Mammogram - recommend yearly screening mammogram.  Mammogram is up to date.  2) STI screening was offered and declined.  3) ASCCP guidelines and rationale discussed.  Patient opts for yearly screening interval.  4) Osteoporosis  - per USPTF routine screening DEXA at age 2  5) Routine healthcare maintenance including cholesterol, diabetes screening managed by PCP.  6) Colonoscopy: her PCP has given her information on Cologuard and she plans to complete testing.  7) Accepted TDaP booster today.  8) Follow up 1 year for annual exam.  Avel Sensor,  CNM 01/25/2018  11:06 AM

## 2018-02-01 LAB — PAP IG AND HPV HIGH-RISK: HPV, high-risk: NEGATIVE

## 2018-02-06 ENCOUNTER — Encounter: Payer: Self-pay | Admitting: Adult Health

## 2018-02-06 ENCOUNTER — Telehealth: Payer: Self-pay

## 2018-02-06 ENCOUNTER — Ambulatory Visit: Payer: Managed Care, Other (non HMO) | Admitting: Adult Health

## 2018-02-06 VITALS — BP 123/87 | HR 88 | Resp 16 | Ht 63.0 in | Wt 152.0 lb

## 2018-02-06 DIAGNOSIS — I10 Essential (primary) hypertension: Secondary | ICD-10-CM

## 2018-02-06 DIAGNOSIS — Z1211 Encounter for screening for malignant neoplasm of colon: Secondary | ICD-10-CM | POA: Diagnosis not present

## 2018-02-06 DIAGNOSIS — R002 Palpitations: Secondary | ICD-10-CM | POA: Diagnosis not present

## 2018-02-06 DIAGNOSIS — K219 Gastro-esophageal reflux disease without esophagitis: Secondary | ICD-10-CM | POA: Diagnosis not present

## 2018-02-06 MED ORDER — AMLODIPINE BESYLATE 10 MG PO TABS: 10.0000 mg | ORAL_TABLET | Freq: Every day | ORAL | 0 refills | Status: DC

## 2018-02-06 MED ORDER — OLMESARTAN MEDOXOMIL 5 MG PO TABS: 5.0000 mg | ORAL_TABLET | Freq: Every day | ORAL | 0 refills | Status: DC

## 2018-02-06 NOTE — Telephone Encounter (Signed)
[email or web address]

## 2018-02-06 NOTE — Progress Notes (Signed)
[facility] [place], [place] [address]  Internal MEDICINE  Office Visit Note  Patient Name: Krystal Evans  [id number]  [id number]  Date of Service: 02/07/2018  Chief Complaint  Patient presents with  . Quality Metric Gaps    needs to do cologuard  . Follow-up  . Hypertension  . Gastroesophageal Reflux    HPI Pt here for follow up on HTN, GERD. She is also in need of colonoscopy to close her quality metric gaps. Pt would like to go Cologuard, and that will be ordered at this visit.  Patient blood pressure is stable at this time.  She continues to report intermittent GERD symptoms and states that she still feels that she needs the medication.    Current Medication: Outpatient Encounter Medications as of 02/06/2018  Medication Sig  . amLODipine (NORVASC) 10 MG tablet Take 1 tablet (10 mg total) by mouth daily.  Marland Kitchen olmesartan (BENICAR) 5 MG tablet Take 1 tablet (5 mg total) by mouth daily.  . [DISCONTINUED] amLODipine (NORVASC) 10 MG tablet TAKE 1 TABLET BY MOUTH  DAILY FOR HYPERTENSION  . [DISCONTINUED] olmesartan (BENICAR) 5 MG tablet Take 1 tablet (5 mg total) by mouth daily.  Marland Kitchen etodolac (LODINE) 400 MG tablet Take 1 tablet (400 mg total) by mouth 2 (two) times daily. (Patient not taking: Reported on 08/04/2017)  . gabapentin (NEURONTIN) 100 MG capsule Take 1 capsule (100 mg total) by mouth 3 (three) times daily. (Patient not taking: Reported on 01/25/2018)  . losartan (COZAAR) 25 MG tablet Take 1 tablet (25 mg total) by mouth daily. (Patient not taking: Reported on 02/06/2018)  . methylPREDNISolone (MEDROL) 4 MG tablet 6 day dose pack. Take by mouth as directed (Patient not taking: Reported on 08/04/2017)  . tiZANidine (ZANAFLEX) 4 MG tablet Take 1 tablet (4 mg total) by mouth every 6 (six) hours as needed for muscle spasms. (Patient not taking: Reported on 08/04/2017)   No facility-administered encounter medications on file as of 02/06/2018.     Surgical  History: Past Surgical History:  Procedure Laterality Date  . CHOLECYSTECTOMY    . TUBAL LIGATION      Medical History: Past Medical History:  Diagnosis Date  . Abnormal uterine bleeding   . Bursitis   . Congestive heart failure (CHF) ([place])   . Family history of breast cancer 04/19/2012   IBIS 22.29%  . GERD (gastroesophageal reflux disease)   . Hypertension   . Increased risk of breast cancer 04/19/2012   IBIS 22.29  . Vitamin D deficiency     Family History: Family History  Problem Relation Age of Onset  . Diabetes Mother   . Hypertension Mother   . Breast cancer Mother        45  . Osteoporosis Mother   . Glaucoma Mother   . Osteoporosis Father   . Heart disease Father   . Hypertension Father   . Hypertension Brother   . Diabetes Maternal Aunt   . Breast cancer Paternal Aunt 66       again at 80  . Diabetes Paternal Aunt     Social History   Socioeconomic History  . Marital status: Married    Spouse name: Not on file  . Number of children: 2  . Years of education: Not on file  . Highest education level: Not on file  Occupational History  . Occupation: A/R rep    Employer: [employer]  . Financial resource strain: Not on  file  . Food insecurity:    Worry: Not on file    Inability: Not on file  . Transportation needs:    Medical: Not on file    Non-medical: Not on file  Tobacco Use  . Smoking status: Never Smoker  . Smokeless tobacco: Never Used  Substance and Sexual Activity  . Alcohol use: No  . Drug use: No  . Sexual activity: Yes    Birth control/protection: Surgical  Lifestyle  . Physical activity:    Days per week: 0 days    Minutes per session: Not on file  . Stress: Not on file  Relationships  . Social connections:    Talks on phone: Not on file    Gets together: Not on file    Attends religious service: Not on file    Active member of club or organization: Not on file    Attends meetings of clubs or organizations: Not  on file    Relationship status: Not on file  . Intimate partner violence:    Fear of current or ex partner: Not on file    Emotionally abused: Not on file    Physically abused: Not on file    Forced sexual activity: Not on file  Other Topics Concern  . Not on file  Social History Narrative  . Not on file      Review of Systems  Constitutional: Negative for chills, fatigue and unexpected weight change.  HENT: Negative for congestion, rhinorrhea, sneezing and sore throat.   Eyes: Negative for photophobia, pain and redness.  Respiratory: Negative for cough, chest tightness and shortness of breath.   Cardiovascular: Negative for chest pain and palpitations.  Gastrointestinal: Negative for abdominal pain, constipation, diarrhea, nausea and vomiting.  Endocrine: Negative.   Genitourinary: Negative for dysuria and frequency.  Musculoskeletal: Negative for arthralgias, back pain, joint swelling and neck pain.  Skin: Negative for rash.  Allergic/Immunologic: Negative.   Neurological: Negative for tremors and numbness.  Hematological: Negative for adenopathy. Does not bruise/bleed easily.  Psychiatric/Behavioral: Negative for behavioral problems and sleep disturbance. The patient is not nervous/anxious.     Vital Signs: BP 123/87   Pulse 88   Resp 16   Ht 5\' 3"  (1.6 m)   Wt 152 lb (68.9 kg)   SpO2 97%   BMI 26.93 kg/m    Physical Exam Vitals signs and nursing note reviewed.  Constitutional:      General: She is not in acute distress.    Appearance: She is well-developed. She is not diaphoretic.  HENT:     Head: Normocephalic and atraumatic.     Mouth/Throat:     Pharynx: No oropharyngeal exudate.  Eyes:     Pupils: Pupils are equal, round, and reactive to light.  Neck:     Musculoskeletal: Normal range of motion and neck supple.     Thyroid: No thyromegaly.     Vascular: No JVD.     Trachea: No tracheal deviation.  Cardiovascular:     Rate and Rhythm: Normal rate and  regular rhythm.     Heart sounds: Normal heart sounds. No murmur. No friction rub. No gallop.   Pulmonary:     Effort: Pulmonary effort is normal. No respiratory distress.     Breath sounds: Normal breath sounds. No wheezing or rales.  Chest:     Chest wall: No tenderness.  Abdominal:     Palpations: Abdomen is soft.     Tenderness: There is no abdominal tenderness.  There is no guarding.  Musculoskeletal: Normal range of motion.  Lymphadenopathy:     Cervical: No cervical adenopathy.  Skin:    General: Skin is warm and dry.  Neurological:     Mental Status: She is alert and oriented to person, place, and time.     Cranial Nerves: No cranial nerve deficit.  Psychiatric:        Behavior: Behavior normal.        Thought Content: Thought content normal.        Judgment: Judgment normal.    Assessment/Plan: 1. Essential hypertension Refill patient's blood pressure medications as requested. - olmesartan (BENICAR) 5 MG tablet; Take 1 tablet (5 mg total) by mouth daily.  Dispense: 7 tablet; Refill: 0 - amLODipine (NORVASC) 10 MG tablet; Take 1 tablet (10 mg total) by mouth daily.  Dispense: 7 tablet; Refill: 0  2. Gastro-esophageal reflux disease without esophagitis Stable, continue current medications as prescribed.  3. Palpitations Patient denies any recent palpitations and reports that she is doing well at this time.  4. Screen for colon cancer Cologuard ordered for patient.  General Counseling: eldonna neuenfeldt understanding of the findings of todays visit and agrees with plan of treatment. I have discussed any further diagnostic evaluation that may be needed or ordered today. We also reviewed her medications today. she has been encouraged to call the office with any questions or concerns that should arise related to todays visit.    No orders of the defined types were placed in this encounter.   Meds ordered this encounter  Medications  . olmesartan (BENICAR) 5 MG tablet     Sig: Take 1 tablet (5 mg total) by mouth daily.    Dispense:  7 tablet    Refill:  0  . amLODipine (NORVASC) 10 MG tablet    Sig: Take 1 tablet (10 mg total) by mouth daily.    Dispense:  7 tablet    Refill:  0    Time spent: 25 Minutes   This patient was seen by Orson Gear AGNP-C in Collaboration with Dr Lavera Guise as a part of collaborative care agreement     Kendell Bane AGNP-C Internal medicine

## 2018-02-09 ENCOUNTER — Telehealth: Payer: Self-pay | Admitting: Maternal Newborn

## 2018-02-09 NOTE — Telephone Encounter (Signed)
Patient is calling for labs results. Please advise. 

## 2018-02-22 IMAGING — MG 2D DIGITAL DIAGNOSTIC BILATERAL MAMMOGRAM WITH CAD AND ADJUNCT T
8 of 15 series · 8 of 35 positions shown · non-contrast
Comparison: Previous exam(s).

CLINICAL DATA: Palpable area of concern in the far outer left
breast, [DATE] to 3 o'clock region, near the junction of the breast
and chest wall. Family history of breast cancer in her mother. She
states that her mother was diagnosed with breast cancer in her early
20s.

EXAM:
2D DIGITAL DIAGNOSTIC BILATERAL MAMMOGRAM WITH CAD AND ADJUNCT TOMO
ULTRASOUND LEFT BREAST

[L MLO synth-2D]
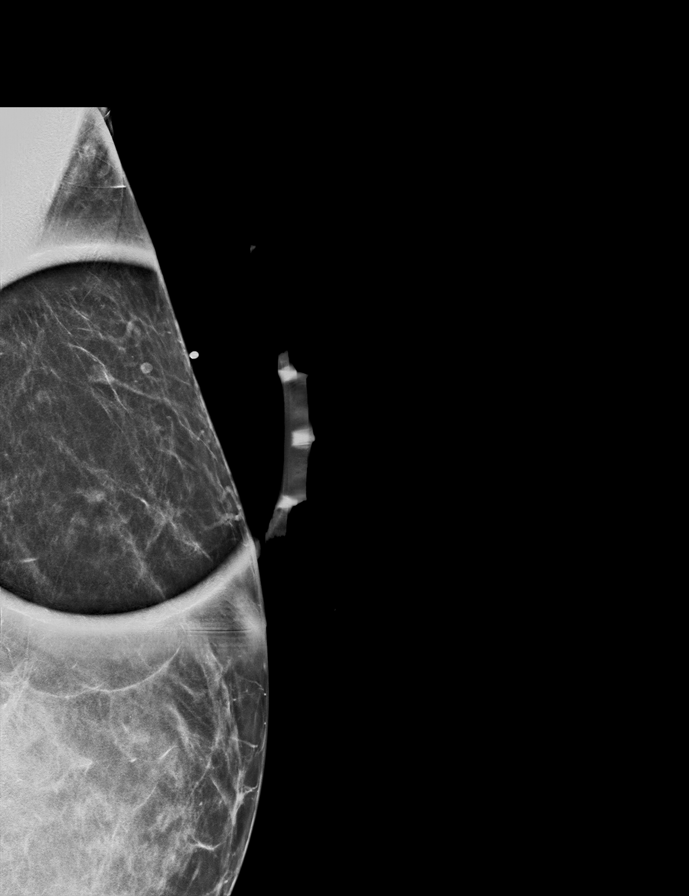

[L CC]
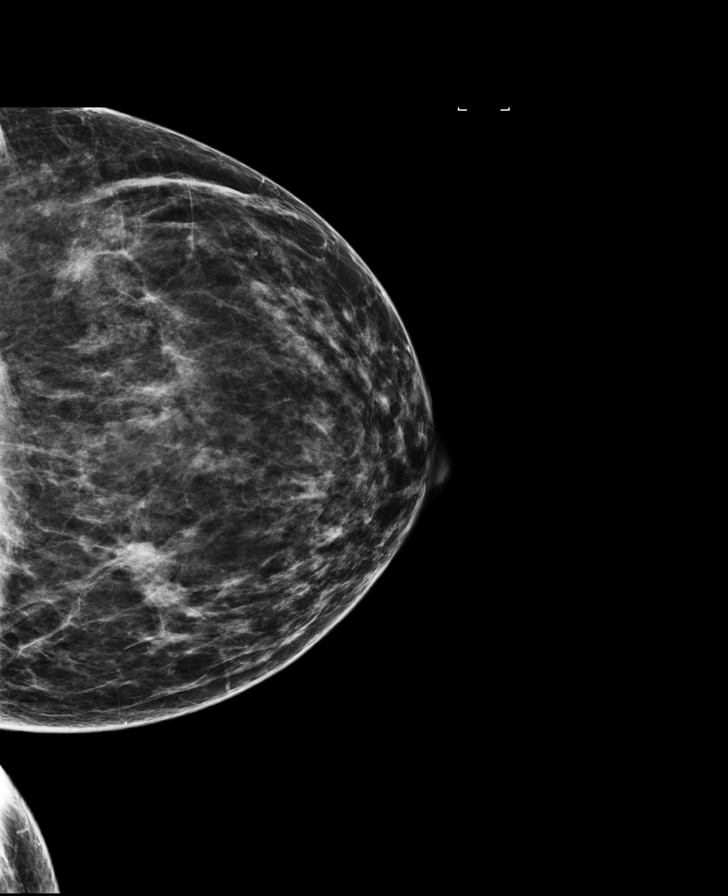

[R CC synth-2D]
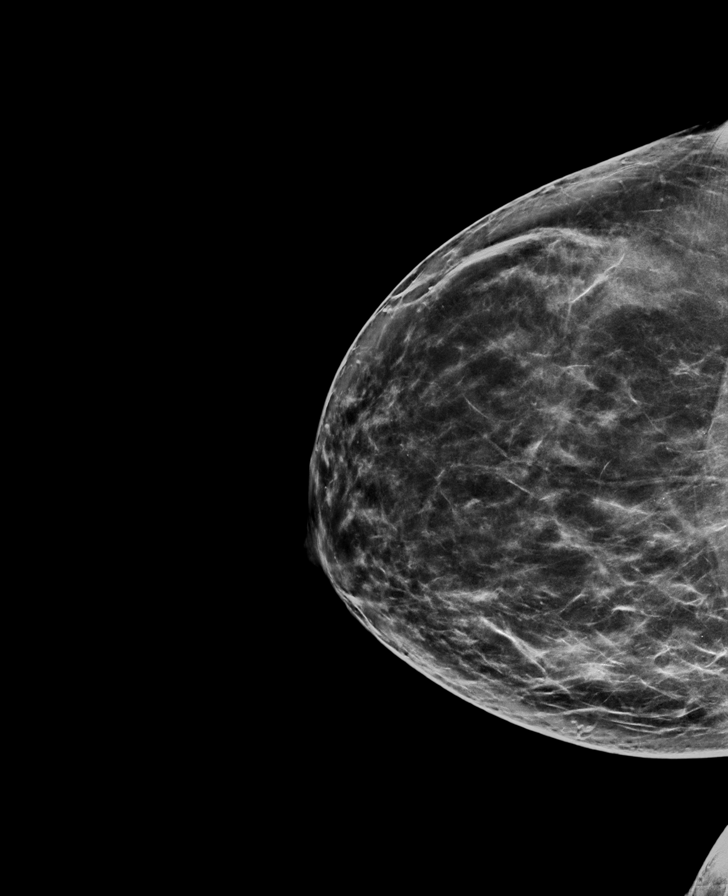

[L MLO (1 of 2)]
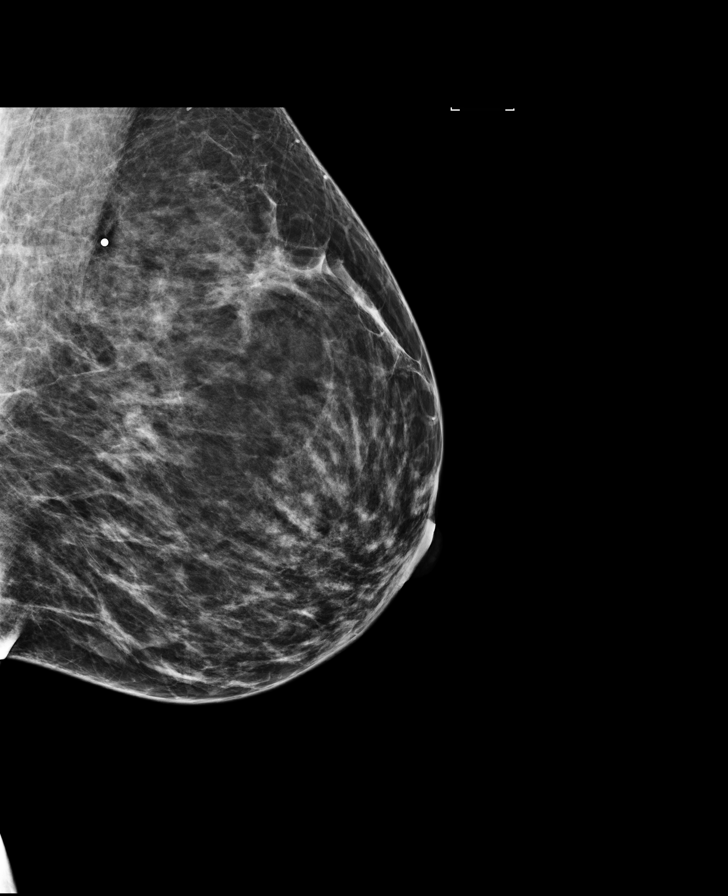

[R MLO]
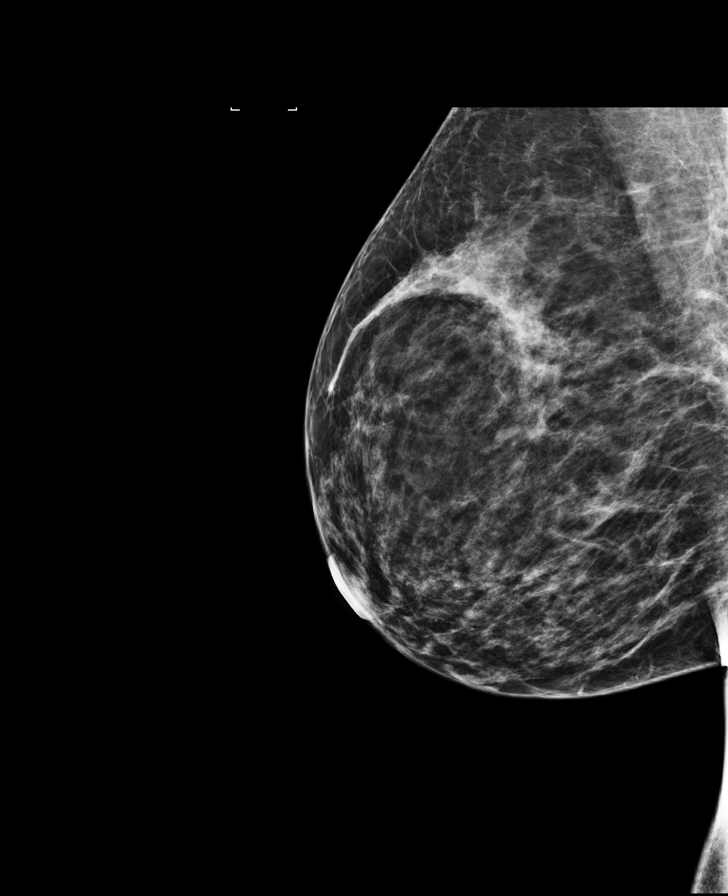

[R CC]
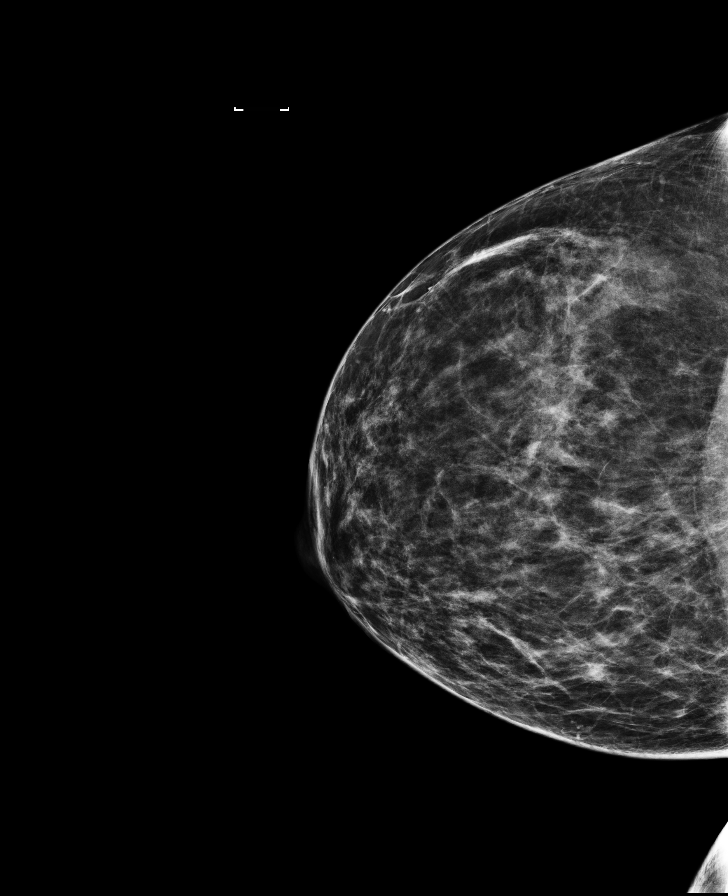

[L CC synth-2D]
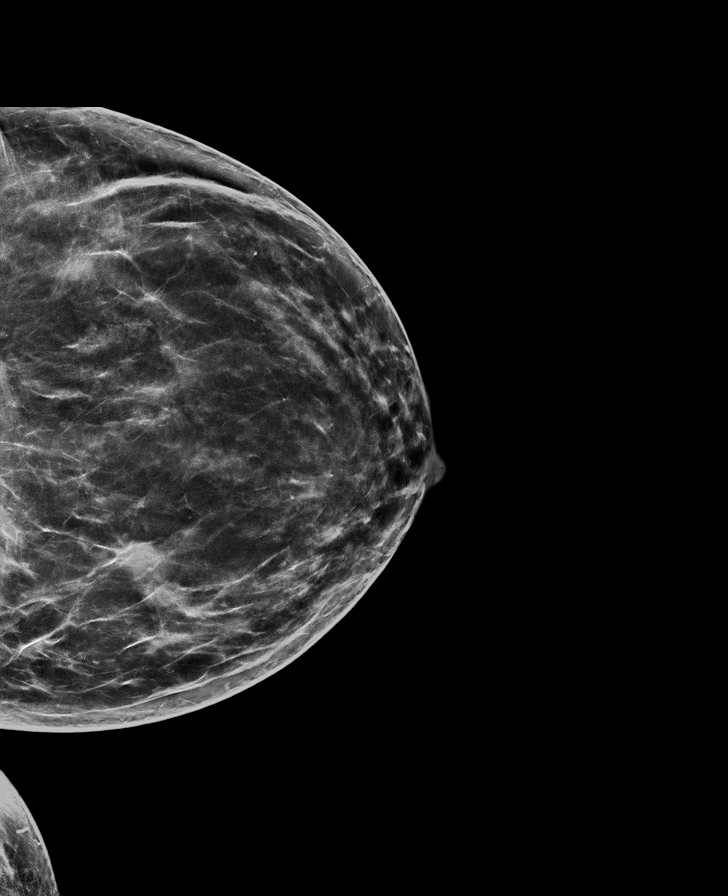

[L MLO (2 of 2)]
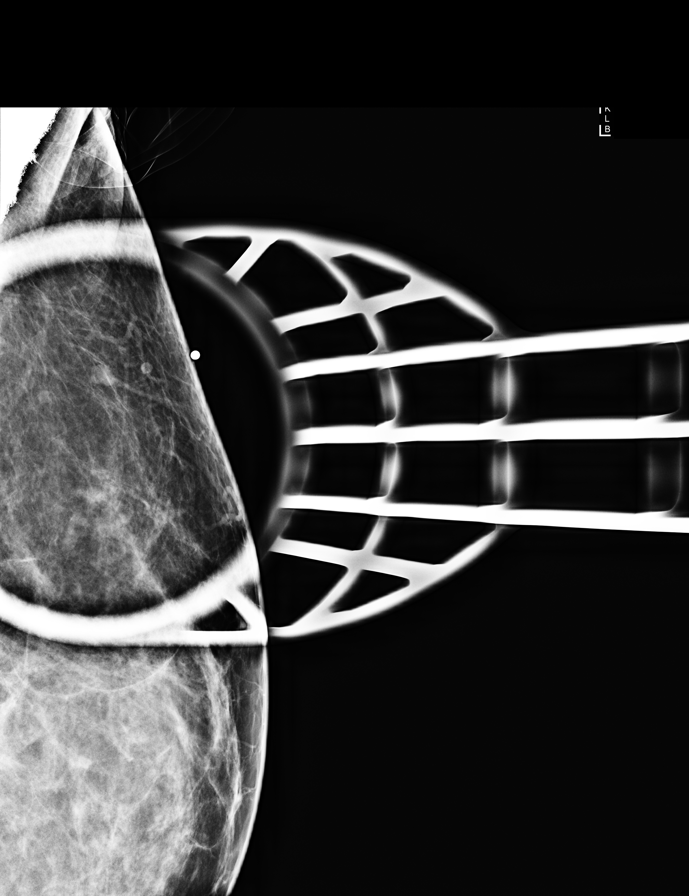

[8 of 35 positions shown; findings below may reference images not displayed]

ACR Breast Density Category b: There are scattered areas of
fibroglandular density.
FINDINGS: The parenchymal pattern of the left breast is stable. A metallic
skin marker was placed in the site of patient concern. Spot
tangential view of this region shows scattered fibroglandular
densities. Incidentally seen in this region of the left breast is a
3 mm benign intramammary lymph node.

No mass, architectural distortion, or suspicious microcalcification
is identified in either breast to suggest malignancy.

Mammographic images were processed with CAD.

On physical exam, I do not palpate a discrete mass in the far outer
left breast in the region of patient concern.

Targeted ultrasound is performed, showing normal fibroglandular
tissue. No solid or cystic mass or abnormal shadowing is identified
in the region of patient concern.
IMPRESSION: No evidence of malignancy in either breast.

RECOMMENDATION:
Screening mammogram in one year.(Code:[id number])

I have discussed the findings and recommendations with the patient.
Results were also provided in writing at the conclusion of the
visit. If applicable, a reminder letter will be sent to the patient
regarding the next appointment.

BI-RADS CATEGORY  1: Negative.

## 2018-03-30 ENCOUNTER — Ambulatory Visit
Admission: RE | Admit: 2018-03-30 | Discharge: 2018-03-30 | Disposition: A | Discharge status: Home / Self Care | Payer: 59 | Attending: Nurse Practitioner | Admitting: Nurse Practitioner

## 2018-03-30 ENCOUNTER — Ambulatory Visit
Admission: RE | Admit: 2018-03-30 | Discharge: 2018-03-30 | Disposition: A | Discharge status: Home / Self Care | Payer: 59 | Source: Ambulatory Visit | Attending: Nurse Practitioner | Admitting: Nurse Practitioner

## 2018-03-30 ENCOUNTER — Ambulatory Visit: Payer: Managed Care, Other (non HMO) | Admitting: Nurse Practitioner

## 2018-03-30 ENCOUNTER — Encounter: Payer: Self-pay | Admitting: Nurse Practitioner

## 2018-03-30 VITALS — BP 140/90 | HR 100 | Resp 16 | Ht 63.0 in | Wt 156.0 lb

## 2018-03-30 DIAGNOSIS — R5383 Other fatigue: Secondary | ICD-10-CM | POA: Diagnosis not present

## 2018-03-30 DIAGNOSIS — M791 Myalgia, unspecified site: Secondary | ICD-10-CM | POA: Diagnosis not present

## 2018-03-30 DIAGNOSIS — M25511 Pain in right shoulder: Secondary | ICD-10-CM | POA: Diagnosis not present

## 2018-03-30 DIAGNOSIS — M1990 Unspecified osteoarthritis, unspecified site: Secondary | ICD-10-CM

## 2018-03-30 NOTE — Progress Notes (Signed)
[facility] [place], [place] [address]  Internal MEDICINE  Office Visit Note  Patient Name: Krystal Evans  [id number]  [id number]  Date of Service: 04/12/2018   Pt is here for a sick visit.   Chief Complaint  Patient presents with  . Neck Pain    stiffness all over the body   . Joint Pain     The patient states that she has moderate right shoulder pain which is getting worse. Starting to interfere with normal activities. She has noted reduced range of motioin and strength. She also states that she has increased fatigue along with tenderness of multiple joints, especially in the fingers, hands, hips, and knees.  She has not noted any swelling or deformities. She states that her grandmother has rheumatoid arthritis.       Current Medication:  Outpatient Encounter Medications as of 03/30/2018  Medication Sig  . olmesartan (BENICAR) 5 MG tablet Take 1 tablet (5 mg total) by mouth daily.  . [DISCONTINUED] amLODipine (NORVASC) 10 MG tablet Take 1 tablet (10 mg total) by mouth daily.  . [DISCONTINUED] losartan (COZAAR) 25 MG tablet Take 1 tablet (25 mg total) by mouth daily.  . [DISCONTINUED] methylPREDNISolone (MEDROL) 4 MG tablet 6 day dose pack. Take by mouth as directed  . [DISCONTINUED] etodolac (LODINE) 400 MG tablet Take 1 tablet (400 mg total) by mouth 2 (two) times daily. (Patient not taking: Reported on 03/30/2018)  . [DISCONTINUED] gabapentin (NEURONTIN) 100 MG capsule Take 1 capsule (100 mg total) by mouth 3 (three) times daily. (Patient not taking: Reported on 03/30/2018)  . [DISCONTINUED] tiZANidine (ZANAFLEX) 4 MG tablet Take 1 tablet (4 mg total) by mouth every 6 (six) hours as needed for muscle spasms. (Patient not taking: Reported on 03/30/2018)   No facility-administered encounter medications on file as of 03/30/2018.       Medical History: Past Medical History:  Diagnosis Date  . Abnormal uterine bleeding   . Bursitis   . Congestive  heart failure (CHF) ([place])   . Family history of breast cancer 04/19/2012   IBIS 22.29%  . GERD (gastroesophageal reflux disease)   . Hypertension   . Increased risk of breast cancer 04/19/2012   IBIS 22.29  . Vitamin D deficiency      Today's Vitals   03/30/18 1440  BP: 140/90  Pulse: 100  Resp: 16  SpO2: 97%  Weight: 156 lb (70.8 kg)  Height: 5\' 3"  (1.6 m)   Body mass index is 27.63 kg/m.  Review of Systems  Constitutional: Positive for activity change and fatigue. Negative for chills and unexpected weight change.  HENT: Negative for congestion, postnasal drip, rhinorrhea, sneezing and sore throat.   Respiratory: Negative for cough, chest tightness, shortness of breath and wheezing.   Cardiovascular: Negative for chest pain and palpitations.  Gastrointestinal: Negative for abdominal pain, constipation, diarrhea, nausea and vomiting.  Musculoskeletal: Positive for arthralgias and myalgias. Negative for back pain, joint swelling and neck pain.       Right shoulder pain. Now having reduced ROM and strength. Generalized joint pain and tenderness.   Skin: Negative for rash.  Neurological: Negative for dizziness, tremors, numbness and headaches.  Hematological: Negative for adenopathy. Does not bruise/bleed easily.  Psychiatric/Behavioral: Negative for behavioral problems (Depression), sleep disturbance and suicidal ideas. The patient is not nervous/anxious.     Physical Exam Vitals signs and nursing note reviewed.  Constitutional:      General: She is in acute distress.  Appearance: She is well-developed. She is not diaphoretic.  HENT:     Head: Normocephalic and atraumatic.     Mouth/Throat:     Pharynx: No oropharyngeal exudate.  Eyes:     Pupils: Pupils are equal, round, and reactive to light.  Neck:     Musculoskeletal: Normal range of motion and neck supple. No muscular tenderness.     Thyroid: No thyromegaly.     Vascular: No JVD.     Trachea: No tracheal  deviation.  Cardiovascular:     Rate and Rhythm: Normal rate and regular rhythm.     Heart sounds: Normal heart sounds. No murmur. No friction rub. No gallop.   Pulmonary:     Effort: Pulmonary effort is normal. No respiratory distress.     Breath sounds: Normal breath sounds. No wheezing or rales.  Chest:     Chest wall: No tenderness.  Abdominal:     General: Bowel sounds are normal.     Palpations: Abdomen is soft.     Tenderness: There is no abdominal tenderness.  Musculoskeletal: Normal range of motion.       Arms:     Comments: Generalized joint pain with some point tenderness present. No swelling or redness, or other abnormalities or deformities noted at this time.   Lymphadenopathy:     Cervical: No cervical adenopathy.  Skin:    General: Skin is warm and dry.  Neurological:     Mental Status: She is alert and oriented to person, place, and time.     Cranial Nerves: No cranial nerve deficit.  Psychiatric:        Behavior: Behavior normal.        Thought Content: Thought content normal.        Judgment: Judgment normal.   Assessment/Plan: 1. Right shoulder pain, unspecified chronicity Rest and ice the shoulder. Encouraged her to use OTC antiinflammatories as needed and as indicated to reduce pain and inflammation. Will get x-ray of right shoulder for further evaluation.  - DG Shoulder Right; Future  2. Inflammatory osteoarthritis Connective tissue panel ordered for further evaluation. Refer to rheumatology as indicated.   3. Myalgia Connective tissue panel ordered for further evaluation. Refer to rheumatology as indicated.    4. Other fatigue Connective tissue panel ordered for further evaluation. Refer to rheumatology as indicated.    General Counseling: adriona kaney understanding of the findings of todays visit and agrees with plan of treatment. I have discussed any further diagnostic evaluation that may be needed or ordered today. We also reviewed her  medications today. she has been encouraged to call the office with any questions or concerns that should arise related to todays visit.    Counseling:  This patient was seen by Leretha Pol FNP Collaboration with Dr Lavera Guise as a part of collaborative care agreement  Orders Placed This Encounter  Procedures  . DG Shoulder Right      Time spent: 25 Minutes

## 2018-03-31 ENCOUNTER — Other Ambulatory Visit: Payer: Self-pay | Admitting: Nurse Practitioner

## 2018-04-01 LAB — COMPREHENSIVE METABOLIC PANEL
ALK PHOS: 85 IU/L (ref 39–117)
ALT: 13 IU/L (ref 0–32)
AST: 16 IU/L (ref 0–40)
Albumin/Globulin Ratio: 1.7 (ref 1.2–2.2)
Albumin: 4.5 g/dL (ref 3.8–4.9)
BILIRUBIN TOTAL: 0.4 mg/dL (ref 0.0–1.2)
BUN/Creatinine Ratio: 9 (ref 9–23)
BUN: 8 mg/dL (ref 6–24)
CHLORIDE: 102 mmol/L (ref 96–106)
CO2: 25 mmol/L (ref 20–29)
CREATININE: 0.91 mg/dL (ref 0.57–1.00)
Calcium: 9.6 mg/dL (ref 8.7–10.2)
GFR calc Af Amer: 84 mL/min/{1.73_m2} (ref >59)
GFR calc non Af Amer: 73 mL/min/{1.73_m2} (ref >59)
GLUCOSE: 97 mg/dL (ref 65–99)
Globulin, Total: 2.6 g/dL (ref 1.5–4.5)
Potassium: 4.2 mmol/L (ref 3.5–5.2)
Sodium: 143 mmol/L (ref 134–144)
Total Protein: 7.1 g/dL (ref 6.0–8.5)

## 2018-04-01 LAB — LIPID PANEL W/O CHOL/HDL RATIO
Cholesterol, Total: 195 mg/dL (ref 100–199)
HDL: 74 mg/dL (ref >39)
LDL CALC: 103 mg/dL — AB (ref 0–99)
Triglycerides: 89 mg/dL (ref 0–149)
VLDL CHOLESTEROL CAL: 18 mg/dL (ref 5–40)

## 2018-04-01 LAB — CBC
HEMATOCRIT: 34.6 % (ref 34.0–46.6)
HEMOGLOBIN: 11.7 g/dL (ref 11.1–15.9)
MCH: 29.7 pg (ref 26.6–33.0)
MCHC: 33.8 g/dL (ref 31.5–35.7)
MCV: 88 fL (ref 79–97)
PLATELETS: 276 10*3/uL (ref 150–450)
RBC: 3.94 x10E6/uL (ref 3.77–5.28)
RDW: 13.1 % (ref 11.7–15.4)
WBC: 5.8 10*3/uL (ref 3.4–10.8)

## 2018-04-01 LAB — B12 AND FOLATE PANEL
Folate: 8.3 ng/mL (ref >3.0)
Vitamin B-12: 301 pg/mL (ref 232–1245)

## 2018-04-01 LAB — ENA+DNA/DS+SJORGEN'S
ENA RNP Ab: >8 AI — ABNORMAL HIGH (ref 0.0–0.9)
ENA SSB (LA) Ab: <0.2 AI (ref 0.0–0.9)
dsDNA Ab: <1 IU/mL (ref 0–9)

## 2018-04-01 LAB — HGB A1C W/O EAG: Hgb A1c MFr Bld: 5.9 % — ABNORMAL HIGH (ref 4.8–5.6)

## 2018-04-01 LAB — MAGNESIUM: Magnesium: 2 mg/dL (ref 1.6–2.3)

## 2018-04-01 LAB — ANA W/REFLEX: Anti Nuclear Antibody(ANA): POSITIVE — AB

## 2018-04-01 LAB — T4, FREE: Free T4: 1 ng/dL (ref 0.82–1.77)

## 2018-04-01 LAB — SEDIMENTATION RATE: Sed Rate: 19 mm/h (ref 0–40)

## 2018-04-01 LAB — RHEUMATOID FACTOR

## 2018-04-01 LAB — URIC ACID: URIC ACID: 5.3 mg/dL (ref 2.5–7.1)

## 2018-04-01 LAB — T3: T3, Total: 119 ng/dL (ref 71–180)

## 2018-04-01 LAB — TSH: TSH: 2.14 u[IU]/mL (ref 0.450–4.500)

## 2018-04-01 LAB — FERRITIN: Ferritin: 180 ng/mL — ABNORMAL HIGH (ref 15–150)

## 2018-04-01 LAB — PHOSPHORUS: PHOSPHORUS: 4.2 mg/dL (ref 3.0–4.3)

## 2018-04-01 LAB — VITAMIN D 25 HYDROXY (VIT D DEFICIENCY, FRACTURES): Vit D, 25-Hydroxy: 8.4 ng/mL — ABNORMAL LOW (ref 30.0–100.0)

## 2018-04-05 ENCOUNTER — Other Ambulatory Visit: Payer: Self-pay

## 2018-04-05 DIAGNOSIS — I10 Essential (primary) hypertension: Secondary | ICD-10-CM

## 2018-04-05 MED ORDER — AMLODIPINE BESYLATE 10 MG PO TABS: 10.0000 mg | ORAL_TABLET | Freq: Every day | ORAL | 1 refills | Status: DC

## 2018-04-07 ENCOUNTER — Ambulatory Visit: Payer: Managed Care, Other (non HMO) | Admitting: Nurse Practitioner

## 2018-04-07 ENCOUNTER — Encounter: Payer: Self-pay | Admitting: Nurse Practitioner

## 2018-04-07 VITALS — BP 124/78 | HR 84 | Resp 16 | Ht 63.0 in | Wt 154.4 lb

## 2018-04-07 DIAGNOSIS — I10 Essential (primary) hypertension: Secondary | ICD-10-CM | POA: Diagnosis not present

## 2018-04-07 DIAGNOSIS — M25511 Pain in right shoulder: Secondary | ICD-10-CM | POA: Diagnosis not present

## 2018-04-07 DIAGNOSIS — M1990 Unspecified osteoarthritis, unspecified site: Secondary | ICD-10-CM

## 2018-04-07 MED ORDER — METHYLPREDNISOLONE 4 MG PO TBPK: ORAL_TABLET | ORAL | 0 refills | Status: DC

## 2018-04-07 NOTE — Progress Notes (Signed)
[facility] [place], [place] [address]  Internal MEDICINE  Office Visit Note  Patient Name: Krystal Evans  [id number]  [id number]  Date of Service: 04/23/2018  Chief Complaint  Patient presents with  . Labs Only    review labs    The patient states that she has moderate right shoulder pain which is getting worse. Starting to interfere with normal activities. She has noted reduced range of motioin and strength. She also states that she has increased fatigue along with tenderness of multiple joints, especially in the fingers, hands, hips, and knees.  She has not noted any swelling or deformities. She states that her grandmother has rheumatoid arthritis.  She did have x-ray of her right shoulder. Did show mild degenerative changes with a possible loose body adjacent to the anterior aspect of the glenoid and coracoid process.  Labs have also been done. ANA factor positive with significant elevation of RNP ab.       Current Medication: Outpatient Encounter Medications as of 04/07/2018  Medication Sig  . amLODipine (NORVASC) 10 MG tablet Take 1 tablet (10 mg total) by mouth daily.  Marland Kitchen olmesartan (BENICAR) 5 MG tablet Take 1 tablet (5 mg total) by mouth daily.  . [DISCONTINUED] methylPREDNISolone (MEDROL) 4 MG TBPK tablet Take by mouth as directed for 6 days (Patient not taking: Reported on 04/18/2018)  . [DISCONTINUED] tiZANidine (ZANAFLEX) 4 MG tablet Take 1 tablet (4 mg total) by mouth every 6 (six) hours as needed for muscle spasms. (Patient not taking: Reported on 03/30/2018)   No facility-administered encounter medications on file as of 04/07/2018.     Surgical History: Past Surgical History:  Procedure Laterality Date  . CHOLECYSTECTOMY    . TUBAL LIGATION      Medical History: Past Medical History:  Diagnosis Date  . Abnormal uterine bleeding   . Bursitis   . Congestive heart failure (CHF) ([place])   . Family history of breast cancer 04/19/2012    IBIS 22.29%  . GERD (gastroesophageal reflux disease)   . Hypertension   . Increased risk of breast cancer 04/19/2012   IBIS 22.29  . Vitamin D deficiency     Family History: Family History  Problem Relation Age of Onset  . Diabetes Mother   . Hypertension Mother   . Breast cancer Mother        56  . Osteoporosis Mother   . Glaucoma Mother   . Osteoporosis Father   . Heart disease Father   . Hypertension Father   . Hypertension Brother   . Diabetes Maternal Aunt   . Breast cancer Paternal Aunt 60       again at 3  . Diabetes Paternal Aunt     Social History   Socioeconomic History  . Marital status: Married    Spouse name: Not on file  . Number of children: 2  . Years of education: Not on file  . Highest education level: Not on file  Occupational History  . Occupation: A/R rep    Employer: [employer]  . Financial resource strain: Not on file  . Food insecurity:    Worry: Not on file    Inability: Not on file  . Transportation needs:    Medical: Not on file    Non-medical: Not on file  Tobacco Use  . Smoking status: Never Smoker  . Smokeless tobacco: Never Used  Substance and Sexual Activity  . Alcohol use: No  . Drug  use: No  . Sexual activity: Yes    Birth control/protection: Surgical  Lifestyle  . Physical activity:    Days per week: 0 days    Minutes per session: Not on file  . Stress: Not on file  Relationships  . Social connections:    Talks on phone: Not on file    Gets together: Not on file    Attends religious service: Not on file    Active member of club or organization: Not on file    Attends meetings of clubs or organizations: Not on file    Relationship status: Not on file  . Intimate partner violence:    Fear of current or ex partner: Not on file    Emotionally abused: Not on file    Physically abused: Not on file    Forced sexual activity: Not on file  Other Topics Concern  . Not on file  Social History Narrative  .  Not on file      Review of Systems  Constitutional: Positive for activity change and fatigue. Negative for chills and unexpected weight change.  HENT: Negative for congestion, postnasal drip, rhinorrhea, sneezing and sore throat.   Respiratory: Negative for cough, chest tightness, shortness of breath and wheezing.   Cardiovascular: Negative for chest pain and palpitations.  Gastrointestinal: Negative for abdominal pain, constipation, diarrhea, nausea and vomiting.  Musculoskeletal: Positive for arthralgias and myalgias. Negative for back pain, joint swelling and neck pain.       Right shoulder pain. Now having reduced ROM and strength. Generalized joint pain and tenderness.   Skin: Negative for rash.  Neurological: Negative for dizziness, tremors, numbness and headaches.  Hematological: Negative for adenopathy. Does not bruise/bleed easily.  Psychiatric/Behavioral: Negative for behavioral problems (Depression), sleep disturbance and suicidal ideas. The patient is not nervous/anxious.     Today's Vitals   04/07/18 1523  BP: 124/78  Pulse: 84  Resp: 16  SpO2: 98%  Weight: 154 lb 6.4 oz (70 kg)  Height: 5\' 3"  (1.6 m)   Body mass index is 27.35 kg/m.  Physical Exam Vitals signs and nursing note reviewed.  Constitutional:      Appearance: Normal appearance. She is well-developed. She is not diaphoretic.  HENT:     Head: Normocephalic and atraumatic.     Mouth/Throat:     Pharynx: No oropharyngeal exudate.  Eyes:     Pupils: Pupils are equal, round, and reactive to light.  Neck:     Musculoskeletal: Normal range of motion and neck supple. No muscular tenderness.     Thyroid: No thyromegaly.     Vascular: No JVD.     Trachea: No tracheal deviation.  Cardiovascular:     Rate and Rhythm: Normal rate and regular rhythm.     Heart sounds: Normal heart sounds. No murmur. No friction rub. No gallop.   Pulmonary:     Effort: Pulmonary effort is normal. No respiratory distress.      Breath sounds: Normal breath sounds. No wheezing or rales.  Chest:     Chest wall: No tenderness.  Abdominal:     General: Bowel sounds are normal.     Palpations: Abdomen is soft.     Tenderness: There is no abdominal tenderness.  Musculoskeletal: Normal range of motion.       Arms:     Comments: Generalized joint pain with some point tenderness present. No swelling or redness, or other abnormalities or deformities noted at this time.   Lymphadenopathy:  Cervical: No cervical adenopathy.  Skin:    General: Skin is warm and dry.  Neurological:     Mental Status: She is alert and oriented to person, place, and time.     Cranial Nerves: No cranial nerve deficit.  Psychiatric:        Behavior: Behavior normal.        Thought Content: Thought content normal.        Judgment: Judgment normal.    Assessment/Plan: 1. Right shoulder pain, unspecified chronicity Reviewed x-ray of right shoulder. Indicates mild degenerative disease as well as a possible loos body  Adjacent to the glenoid and coracoid process. Refer to orthopedics for further evaluation and treatment. Recommend OTC tylenol and/or ibuprofen to reduce pain and inflammation.  - Ambulatory referral to Orthopedic Surgery  2. Inflammatory osteoarthritis Reviewed labs showing positive ANA and Rnp abs. Refer to rheumatology for further evaluation and treatment. Started medrol dose pack. Take as directed for 6 days to help reduce pain and inflammation.  - Ambulatory referral to Rheumatology  3. Essential hypertension Stable. Continue BP medication as prescribed .  General Counseling: lundon rosier understanding of the findings of todays visit and agrees with plan of treatment. I have discussed any further diagnostic evaluation that may be needed or ordered today. We also reviewed her medications today. she has been encouraged to call the office with any questions or concerns that should arise related to todays visit.  This  patient was seen by Leretha Pol FNP Collaboration with Dr Lavera Guise as a part of collaborative care agreement  Orders Placed This Encounter  Procedures  . Ambulatory referral to Orthopedic Surgery  . Ambulatory referral to Rheumatology    Meds ordered this encounter  Medications  . DISCONTD: methylPREDNISolone (MEDROL) 4 MG TBPK tablet    Sig: Take by mouth as directed for 6 days    Dispense:  21 tablet    Refill:  0    Order Specific Question:   Supervising Provider    Answer:   Lavera Guise [[id number]]    Time spent: 75 Minutes      Dr Lavera Guise Internal medicine

## 2018-04-12 DIAGNOSIS — M25511 Pain in right shoulder: Secondary | ICD-10-CM | POA: Insufficient documentation

## 2018-04-12 DIAGNOSIS — M1990 Unspecified osteoarthritis, unspecified site: Secondary | ICD-10-CM | POA: Insufficient documentation

## 2018-04-12 DIAGNOSIS — R5383 Other fatigue: Secondary | ICD-10-CM | POA: Insufficient documentation

## 2018-04-18 ENCOUNTER — Emergency Department: Payer: Managed Care, Other (non HMO)

## 2018-04-18 ENCOUNTER — Ambulatory Visit: Payer: Managed Care, Other (non HMO) | Admitting: Adult Health

## 2018-04-18 ENCOUNTER — Encounter: Payer: Self-pay | Admitting: *Deleted

## 2018-04-18 ENCOUNTER — Encounter: Payer: Self-pay | Admitting: Adult Health

## 2018-04-18 ENCOUNTER — Emergency Department
Admission: EM | Admit: 2018-04-18 | Discharge: 2018-04-18 | Disposition: A | Discharge status: Home / Self Care | Payer: Managed Care, Other (non HMO) | Attending: Emergency Medicine | Admitting: Emergency Medicine

## 2018-04-18 ENCOUNTER — Other Ambulatory Visit: Payer: Self-pay

## 2018-04-18 VITALS — BP 114/88 | HR 96 | Resp 16 | Ht 63.0 in | Wt 155.0 lb

## 2018-04-18 DIAGNOSIS — R0602 Shortness of breath: Secondary | ICD-10-CM

## 2018-04-18 DIAGNOSIS — I10 Essential (primary) hypertension: Secondary | ICD-10-CM | POA: Diagnosis not present

## 2018-04-18 DIAGNOSIS — R0600 Dyspnea, unspecified: Secondary | ICD-10-CM | POA: Insufficient documentation

## 2018-04-18 DIAGNOSIS — R002 Palpitations: Secondary | ICD-10-CM

## 2018-04-18 DIAGNOSIS — Z79899 Other long term (current) drug therapy: Secondary | ICD-10-CM | POA: Diagnosis not present

## 2018-04-18 DIAGNOSIS — I493 Ventricular premature depolarization: Secondary | ICD-10-CM

## 2018-04-18 DIAGNOSIS — I509 Heart failure, unspecified: Secondary | ICD-10-CM | POA: Insufficient documentation

## 2018-04-18 DIAGNOSIS — I110 Hypertensive heart disease with heart failure: Secondary | ICD-10-CM | POA: Insufficient documentation

## 2018-04-18 DIAGNOSIS — I498 Other specified cardiac arrhythmias: Secondary | ICD-10-CM

## 2018-04-18 LAB — BASIC METABOLIC PANEL
Anion gap: 10 (ref 5–15)
BUN: 12 mg/dL (ref 6–20)
CO2: 26 mmol/L (ref 22–32)
CREATININE: 0.91 mg/dL (ref 0.44–1.00)
Calcium: 9.4 mg/dL (ref 8.9–10.3)
Chloride: 105 mmol/L (ref 98–111)
GFR calc Af Amer: >60 mL/min (ref >60)
GFR calc non Af Amer: >60 mL/min (ref >60)
GLUCOSE: 108 mg/dL — AB (ref 70–99)
Potassium: 4.3 mmol/L (ref 3.5–5.1)
Sodium: 141 mmol/L (ref 135–145)

## 2018-04-18 LAB — CBC
HCT: 37.3 % (ref 36.0–46.0)
Hemoglobin: 12 g/dL (ref 12.0–15.0)
MCH: 29.5 pg (ref 26.0–34.0)
MCHC: 32.2 g/dL (ref 30.0–36.0)
MCV: 91.6 fL (ref 80.0–100.0)
Platelets: 284 10*3/uL (ref 150–400)
RBC: 4.07 MIL/uL (ref 3.87–5.11)
RDW: 13.2 % (ref 11.5–15.5)
WBC: 6.5 10*3/uL (ref 4.0–10.5)
nRBC: 0 % (ref 0.0–0.2)

## 2018-04-18 LAB — TROPONIN I: Troponin I: <0.03 ng/mL (ref <0.03)

## 2018-04-18 MED ORDER — SODIUM CHLORIDE 0.9% FLUSH: 3.0000 mL | Freq: Once | INTRAVENOUS | Status: DC

## 2018-04-18 MED ORDER — METOPROLOL TARTRATE 25 MG PO TABS
12.5000 mg | ORAL_TABLET | Freq: Once | ORAL | Status: AC
  Administered 2018-04-18: 12.5 mg via ORAL
  Filled 2018-04-18: qty 1

## 2018-04-18 MED ORDER — METOPROLOL TARTRATE 25 MG PO TABS: 12.5000 mg | ORAL_TABLET | Freq: Two times a day (BID) | ORAL | 0 refills | Status: DC

## 2018-04-18 NOTE — Progress Notes (Signed)
[facility] [place], [place] [address]  Internal MEDICINE  Office Visit Note  Patient Name: Krystal Evans  [id number]  [id number]  Date of Service: 04/18/2018  Chief Complaint  Patient presents with  . Shortness of Breath    feels like heart is flip flopping during these episodes , been going on since Sunday      HPI Pt is here for a sick visit. Pt reports 3 days of intermittent SOB.  She has also noticed some chest discomfort, with palpitations. She denies any arm, or jaw pain.  The discomfort is not reproducible with palpation.  She reports she drinks one cup of coffee daily, and two cups of tea.  She denise any supplement or energy drink use.        Current Medication:  Outpatient Encounter Medications as of 04/18/2018  Medication Sig  . amLODipine (NORVASC) 10 MG tablet Take 1 tablet (10 mg total) by mouth daily.  . methylPREDNISolone (MEDROL) 4 MG TBPK tablet Take by mouth as directed for 6 days  . olmesartan (BENICAR) 5 MG tablet Take 1 tablet (5 mg total) by mouth daily.   No facility-administered encounter medications on file as of 04/18/2018.       Medical History: Past Medical History:  Diagnosis Date  . Abnormal uterine bleeding   . Bursitis   . Congestive heart failure (CHF) ([place])   . Family history of breast cancer 04/19/2012   IBIS 22.29%  . GERD (gastroesophageal reflux disease)   . Hypertension   . Increased risk of breast cancer 04/19/2012   IBIS 22.29  . Vitamin D deficiency      Vital Signs: BP 114/88   Pulse 96   Resp 16   Ht 5\' 3"  (1.6 m)   Wt 155 lb (70.3 kg)   SpO2 93%   BMI 27.46 kg/m    Review of Systems  Constitutional: Negative for chills, fatigue and unexpected weight change.  HENT: Negative for congestion, rhinorrhea, sneezing and sore throat.   Eyes: Negative for photophobia, pain and redness.  Respiratory: Negative for cough and shortness of breath.   Cardiovascular: Positive for chest pain and  palpitations.  Gastrointestinal: Negative for abdominal pain, constipation, diarrhea, nausea and vomiting.  Endocrine: Negative.   Genitourinary: Negative for dysuria and frequency.  Musculoskeletal: Negative for arthralgias, back pain, joint swelling and neck pain.  Skin: Negative for rash.  Allergic/Immunologic: Negative.   Neurological: Negative for tremors and numbness.  Hematological: Negative for adenopathy. Does not bruise/bleed easily.  Psychiatric/Behavioral: Negative for behavioral problems and sleep disturbance. The patient is not nervous/anxious.     Physical Exam Vitals signs and nursing note reviewed.  Constitutional:      General: She is not in acute distress.    Appearance: She is well-developed. She is not diaphoretic.  HENT:     Head: Normocephalic and atraumatic.     Mouth/Throat:     Pharynx: No oropharyngeal exudate.  Eyes:     Pupils: Pupils are equal, round, and reactive to light.  Neck:     Musculoskeletal: Normal range of motion and neck supple.     Thyroid: No thyromegaly.     Vascular: No JVD.     Trachea: No tracheal deviation.  Cardiovascular:     Rate and Rhythm: Normal rate and regular rhythm.     Heart sounds: Normal heart sounds. No murmur. No friction rub. No gallop.   Pulmonary:     Effort: Pulmonary effort is  normal. No respiratory distress.     Breath sounds: Normal breath sounds. No wheezing or rales.  Chest:     Chest wall: No tenderness.  Abdominal:     Palpations: Abdomen is soft.     Tenderness: There is no abdominal tenderness. There is no guarding.  Musculoskeletal: Normal range of motion.  Lymphadenopathy:     Cervical: No cervical adenopathy.  Skin:    General: Skin is warm and dry.  Neurological:     Mental Status: She is alert and oriented to person, place, and time.     Cranial Nerves: No cranial nerve deficit.  Psychiatric:        Behavior: Behavior normal.        Thought Content: Thought content normal.         Judgment: Judgment normal.    Assessment/Plan: 1. PVC (premature ventricular contraction) PVC seen on EKG.  This in combination with SOB, that does not get better with rest, I have advised patient to go to emergency room to rule out PE, and check enzymes.   2. Essential hypertension Stable, not HTN at this time.  Continue present management.   3. Palpitations Intermittent,She reports episodes are becoming more frequent. Pt should have electrolytes checked.   4. SOB (shortness of breath) EKG shows PVC's.  - EKG 12-Lead  General Counseling: Maysun verbalizes understanding of the findings of todays visit and agrees with plan of treatment. I have discussed any further diagnostic evaluation that may be needed or ordered today. We also reviewed her medications today. she has been encouraged to call the office with any questions or concerns that should arise related to todays visit.   Orders Placed This Encounter  Procedures  . EKG 12-Lead    No orders of the defined types were placed in this encounter.   Time spent: 20 Minutes  This patient was seen by Orson Gear AGNP-C in Collaboration with Dr Lavera Guise as a part of collaborative care agreement.  Kendell Bane AGNP-C Internal Medicine

## 2018-04-18 NOTE — Patient Instructions (Signed)
Premature Ventricular Contraction    A premature ventricular contraction (PVC) is a common kind of irregular heartbeat (arrhythmia). These contractions are extra heartbeats that start in the ventricles of the heart and occur too early in the normal sequence. During the PVC, the heart's normal electrical pathway is not used, so the beat is shorter and less effective. In most cases, these contractions come and go and do not require treatment.  What are the causes?  Common causes of the condition include:   Smoking.   Drinking alcohol.   Certain medicines.   Some illegal drugs.   Stress.   Caffeine.  Certain medical conditions can also cause PVCs:   Heart failure.   Heart attack, or coronary artery disease.   Heart valve problems.   Changes in minerals in the blood (electrolytes).   Low blood oxygen levels or high carbon dioxide levels.  In many cases, the cause of this condition is not known.  What are the signs or symptoms?  The main symptom of this condition is fast or skipped heartbeats (palpitations). Other symptoms include:   Chest pain.   Shortness of breath.   Feeling tired.   Dizziness.   Difficulty exercising.  In some cases, there are no symptoms.  How is this diagnosed?  This condition may be diagnosed based on:   Your medical history.   A physical exam. During the exam, the health care provider will check for irregular heartbeats.   Tests, such as:  ? An ECG (electrocardiogram) to monitor the electrical activity of your heart.  ? Ambulatory cardiac monitor. This device records your heartbeats for 24 hours or more.  ? Stress tests to see how exercise affects your heart rhythm and blood supply.  ? Echocardiogram. This test uses sound waves (ultrasound) to produce an image of your heart.  ? Electrophysiology study (EPS). This test checks for electrical problems in your heart.  How is this treated?  Treatment for this condition depends on any underlying conditions, the type of PVCs that you  are having, and how much the symptoms are interfering with your daily life.  Possible treatments include:   Avoiding things that cause premature contractions (triggers). These include caffeine and alcohol.   Taking medicines if symptoms are severe or if the extra heartbeats are frequent.   Getting treatment for underlying conditions that cause PVCs.   Having an implantable cardioverter defibrillator (ICD), if you are at risk for a serious arrhythmia. The ICD is a small device that is inserted into your chest to monitor your heartbeat. When it senses an irregular heartbeat, it sends a shock to bring the heartbeat back to normal.   Having a procedure to destroy the portion of the heart tissue that sends out abnormal signals (catheter ablation).  In some cases, no treatment is required.  Follow these instructions at home:  Alcohol use     Do not drink alcohol if:  ? Your health care provider tells you not to drink.  ? You are pregnant, may be pregnant, or are planning to become pregnant.  ? Alcohol triggers your episodes.   If you drink alcohol, limit how much you have. You may drink:  ? 0-1 drink a day for women.  ? 0-2 drinks a day for men.   Be aware of how much alcohol is in your drink. In the [place] one drink equals one typical bottle of beer (12 oz), one-half glass of wine (5 oz), or one shot of hard liquor (1   oz).  General instructions   Do not use any products that contain nicotine or tobacco, such as cigarettes and e-cigarettes. If you need help quitting, ask your health care provider.   Find healthy ways to manage stress. Avoid stressful situations when possible.   Exercise regularly. Ask your health care provider what type of exercise is safe for you.   Try to get at least 7-9 hours of sleep each night, or as much as recommended by your health care provider.   If caffeine triggers episodes of PVC, do not eat, drink, or use anything with caffeine in it.   Do not use illegal drugs.   Take  over-the-counter and prescription medicines only as told by your health care provider.   Keep all follow-up visits as told by your health care provider. This is important.  Contact a health care provider if you:   Feel palpitations.  Get help right away if you:   Have chest pain.   Have shortness of breath.   Have sweating for no reason.   Have nausea and vomiting.   Become light-headed or you faint.  Summary   A premature ventricular contraction (PVC) is a common kind of irregular heartbeat (arrhythmia).   In most cases, these contractions come and go and do not require treatment.   You may need to wear an ambulatory cardiac monitor. This records your heartbeats for 24 hours or more.   Treatment depends on any underlying conditions, the type of PVCs that you are having, and how much the symptoms are interfering with your daily life.  This information is not intended to replace advice given to you by your health care provider. Make sure you discuss any questions you have with your health care provider.  Document Released: 09/12/2003 Document Revised: 09/09/2017 Document Reviewed: 03/15/2017  [employer]vier Interactive Patient Education  2019 [employer]

## 2018-04-18 NOTE — ED Provider Notes (Signed)
[facility] Emergency Department Provider Note  Time seen: 7:10 PM  I have reviewed the triage vital signs and the nursing notes.   HISTORY  Chief Complaint Palpitations and Shortness of Breath    HPI Krystal Evans is a 53 y.o. female with a past medical history of CHF, gastric reflux, hypertension, presents to the emergency department for palpitations and shortness of breath.  According to the patient over the past 3 days she has been intermittently short of breath which she describes as feeling like she cannot take a full deep breath in.  Also states at times she is feeling palpitations described as a skipped heartbeat.  Denies any pain pressure or tightness at any point.  Denies any leg pain or swelling.  Patient went to her PCP for evaluation and was referred to the emergency department.  Currently the patient appears well, no distress.  Denies any recent cough/congestion.  No fever.  Largely negative review of systems otherwise.   Past Medical History:  Diagnosis Date  . Abnormal uterine bleeding   . Bursitis   . Congestive heart failure (CHF) ([place])   . Family history of breast cancer 04/19/2012   IBIS 22.29%  . GERD (gastroesophageal reflux disease)   . Hypertension   . Increased risk of breast cancer 04/19/2012   IBIS 22.29  . Vitamin D deficiency     Patient Active Problem List   Diagnosis Date Noted  . Right shoulder pain 04/12/2018  . 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 Other fatigue 04/12/2018  . Bursitis of right hip 08/28/2017  . Myalgia 02/04/2017  . Palpitations 02/04/2017  . Gastro-esophageal reflux disease without esophagitis 02/04/2017  . Essential hypertension 07/07/2016  . Perimenopausal symptoms 07/07/2016  . Family history of breast cancer 07/07/2016  . Vitamin D deficiency 07/07/2016    Past Surgical History:  Procedure Laterality Date  . CHOLECYSTECTOMY    . TUBAL LIGATION      Prior to Admission medications    Medication Sig Start Date End Date Taking? Authorizing Provider  amLODipine (NORVASC) 10 MG tablet Take 1 tablet (10 mg total) by mouth daily. 04/05/18  Yes Boscia, Greer Ee, NP  olmesartan (BENICAR) 5 MG tablet Take 1 tablet (5 mg total) by mouth daily. 02/06/18  Yes Kendell Bane, NP    No Known Allergies  Family History  Problem Relation Age of Onset  . Diabetes Mother   . Hypertension Mother   . Breast cancer Mother        74  . Osteoporosis Mother   . Glaucoma Mother   . Osteoporosis Father   . Heart disease Father   . Hypertension Father   . Hypertension Brother   . Diabetes Maternal Aunt   . Breast cancer Paternal Aunt 16       again at 55  . Diabetes Paternal Aunt     Social History Social History   Tobacco Use  . Smoking status: Never Smoker  . Smokeless tobacco: Never Used  Substance Use Topics  . Alcohol use: No  . Drug use: No    Review of Systems Constitutional: Negative for fever. ENT: Negative for recent illness/congestion Cardiovascular: Negative for chest pain.  Occasional palpitations. Respiratory: Mild intermittent shortness of breath, denies any currently. Gastrointestinal: Negative for abdominal pain Genitourinary: Negative for urinary compaints Musculoskeletal: Negative for leg pain or swelling. Skin: Negative for skin complaints  Neurological: Negative for headache All other ROS negative  ____________________________________________   PHYSICAL EXAM:  VITAL  SIGNS: ED Triage Vitals  Enc Vitals Group     BP 04/18/18 1806 (!) 132/95     Pulse Rate 04/18/18 1536 87     Resp 04/18/18 1536 18     Temp 04/18/18 1536 98.6 F (37 C)     Temp Source 04/18/18 1536 Oral     SpO2 04/18/18 1536 100 %     Weight 04/18/18 1537 155 lb (70.3 kg)     Height 04/18/18 1537 5\' 3"  (1.6 m)     Head Circumference --      Peak Flow --      Pain Score 04/18/18 1803 0     Pain Loc --      Pain Edu? --      Excl. in [place]? --    Constitutional: Alert  and oriented. Well appearing and in no distress. Eyes: Normal exam ENT   Head: Normocephalic and atraumatic.   Mouth/Throat: Mucous membranes are moist. Cardiovascular: Normal rate, regular rhythm. No murmur Respiratory: Normal respiratory effort without tachypnea nor retractions. Breath sounds are clear and equal bilaterally. No wheezes/rales/rhonchi. Gastrointestinal: Soft and nontender. No distention.   Musculoskeletal: Nontender with normal range of motion in all extremities. No lower extremity tenderness or edema. Neurologic:  Normal speech and language. No gross focal neurologic deficits Skin:  Skin is warm, dry and intact.  Psychiatric: Mood and affect are normal.  ____________________________________________    EKG  EKG viewed and interpreted by myself shows a normal sinus rhythm 84 bpm with a narrow QRS, normal axis, normal intervals, no concerning ST changes.  ____________________________________________    RADIOLOGY  Chest x-ray is negative  ____________________________________________   INITIAL IMPRESSION / ASSESSMENT AND PLAN / ED COURSE  Pertinent labs & imaging results that were available during my care of the patient were reviewed by me and considered in my medical decision making (see chart for details).  Patient presents to the emergency department for palpitations and intermittent shortness of breath.  Denies any shortness of breath currently but continues to have palpitations.  Differential would include benign palpitations, arrhythmias, pneumonia, CHF/pulmonary edema, ACS.  Overall the patient appears extremely well, labs are reassuring including a negative troponin.  Chest x-ray is normal.  EKG largely reassuring as well besides one ectopic beat most consistent with PAC.  While watching the patient on telemetry patient will have occasional ectopic beats every 3-10 normal beats.  Patient states she gets approximately 7 to 8 hours of sleep per night, only  has 1 cup of coffee per day, no alcohol use.  Patient denies any pain at any point.  Denies any leg pain or swelling, none on exam either.  Patient states the palpitations are concerning to her and she believes that is what is causing her to feel short of breath when she feels the palpitation.  Given 3 days of palpitations I do not believe it is unreasonable to try the patient on a very low dose of beta-blocker such as metoprolol and have the patient follow-up with cardiology.  Patient states she has seen Dr. Clayborn Evans many years ago and, we will refer back to Dr. Clayborn Evans.  From an acute standpoint I believe the patient is overall very well-appearing with a reassuring work-up and is safe for discharge home with outpatient follow-up.  ____________________________________________   FINAL CLINICAL IMPRESSION(S) / ED DIAGNOSES  Palpitations Dyspnea   Harvest Dark, MD 04/18/18 1914

## 2018-04-18 NOTE — ED Triage Notes (Signed)
Pt sent from DR. Stephanie Coup for eval of sob and palpitations for 2 days.  No chest pain.  No n/v  Pt alert  Speech clear.

## 2018-04-18 NOTE — Discharge Instructions (Addendum)
Please begin taking your medication as prescribed starting tomorrow 04/19/2018.  Please call the number provided for cardiology to arrange a follow-up appointment.  Please also follow-up with your primary care doctor in the next several days for recheck/reevaluation.  Return to the emergency department for any chest pain, worsening shortness of breath, or any other symptom personally concerning to yourself.

## 2018-04-24 DIAGNOSIS — R7689 Other specified abnormal immunological findings in serum: Secondary | ICD-10-CM | POA: Insufficient documentation

## 2018-04-24 DIAGNOSIS — R768 Other specified abnormal immunological findings in serum: Secondary | ICD-10-CM | POA: Insufficient documentation

## 2018-04-24 DIAGNOSIS — M7551 Bursitis of right shoulder: Secondary | ICD-10-CM | POA: Insufficient documentation

## 2018-08-02 ENCOUNTER — Other Ambulatory Visit: Payer: Self-pay

## 2018-08-02 DIAGNOSIS — I10 Essential (primary) hypertension: Secondary | ICD-10-CM

## 2018-08-02 MED ORDER — OLMESARTAN MEDOXOMIL 5 MG PO TABS: 5.0000 mg | ORAL_TABLET | Freq: Every day | ORAL | 0 refills | Status: DC

## 2018-09-18 ENCOUNTER — Telehealth: Payer: Self-pay

## 2018-09-20 ENCOUNTER — Other Ambulatory Visit: Payer: Self-pay | Admitting: Nurse Practitioner

## 2018-09-20 DIAGNOSIS — I10 Essential (primary) hypertension: Secondary | ICD-10-CM

## 2018-09-20 MED ORDER — OLMESARTAN MEDOXOMIL 5 MG PO TABS: 5.0000 mg | ORAL_TABLET | Freq: Every day | ORAL | 0 refills | Status: DC

## 2018-09-20 MED ORDER — AMLODIPINE BESYLATE 10 MG PO TABS: 10.0000 mg | ORAL_TABLET | Freq: Every day | ORAL | 0 refills | Status: DC

## 2018-09-20 NOTE — Telephone Encounter (Signed)
Pt was notified.  

## 2018-09-20 NOTE — Telephone Encounter (Signed)
If she wants to try it she can.  She should just keep an eye on her bp.

## 2018-11-16 ENCOUNTER — Other Ambulatory Visit: Payer: Self-pay

## 2018-11-16 DIAGNOSIS — Z20822 Contact with and (suspected) exposure to covid-19: Secondary | ICD-10-CM

## 2018-11-17 LAB — NOVEL CORONAVIRUS, NAA: SARS-CoV-2, NAA: NOT DETECTED

## 2018-12-25 ENCOUNTER — Other Ambulatory Visit: Payer: Self-pay | Admitting: Internal Medicine

## 2018-12-25 DIAGNOSIS — I10 Essential (primary) hypertension: Secondary | ICD-10-CM

## 2019-02-05 ENCOUNTER — Telehealth: Payer: Self-pay

## 2019-02-05 NOTE — Telephone Encounter (Signed)
Patient cancelled appointment on 02/07/2019. klh

## 2019-02-07 ENCOUNTER — Ambulatory Visit: Payer: Self-pay | Admitting: Adult Health

## 2019-02-26 ENCOUNTER — Other Ambulatory Visit: Payer: Self-pay | Admitting: Nurse Practitioner

## 2019-02-26 DIAGNOSIS — Z1231 Encounter for screening mammogram for malignant neoplasm of breast: Secondary | ICD-10-CM

## 2019-03-21 ENCOUNTER — Ambulatory Visit
Admission: RE | Admit: 2019-03-21 | Discharge: 2019-03-21 | Disposition: A | Discharge status: Home / Self Care | Payer: 59 | Source: Ambulatory Visit | Attending: Nurse Practitioner | Admitting: Nurse Practitioner

## 2019-03-21 DIAGNOSIS — Z1231 Encounter for screening mammogram for malignant neoplasm of breast: Secondary | ICD-10-CM | POA: Insufficient documentation

## 2019-03-26 NOTE — Progress Notes (Signed)
Negative mammo

## 2019-04-23 ENCOUNTER — Other Ambulatory Visit: Payer: Self-pay

## 2019-04-24 ENCOUNTER — Other Ambulatory Visit: Payer: Self-pay

## 2019-04-24 ENCOUNTER — Ambulatory Visit: Payer: Managed Care, Other (non HMO) | Admitting: Nurse Practitioner

## 2019-04-24 ENCOUNTER — Encounter: Payer: Self-pay | Admitting: Nurse Practitioner

## 2019-04-24 VITALS — BP 122/84 | HR 87 | Temp 97.4°F | Resp 16 | Ht 63.0 in | Wt 150.6 lb

## 2019-04-24 DIAGNOSIS — R590 Localized enlarged lymph nodes: Secondary | ICD-10-CM | POA: Diagnosis not present

## 2019-04-24 DIAGNOSIS — R002 Palpitations: Secondary | ICD-10-CM

## 2019-04-24 DIAGNOSIS — I10 Essential (primary) hypertension: Secondary | ICD-10-CM | POA: Diagnosis not present

## 2019-04-24 MED ORDER — AMLODIPINE BESYLATE 10 MG PO TABS: 10.0000 mg | ORAL_TABLET | Freq: Every day | ORAL | 3 refills | Status: DC

## 2019-04-24 MED ORDER — METOPROLOL TARTRATE 25 MG PO TABS: 12.5000 mg | ORAL_TABLET | Freq: Two times a day (BID) | ORAL | 2 refills | Status: DC | PRN

## 2019-04-24 NOTE — Progress Notes (Signed)
[facility] [place],  [address]  Internal MEDICINE  Office Visit Note  Patient Name: Krystal Evans  [id number]  [id number]  Date of Service: 05/02/2019  Chief Complaint  Patient presents with  . Hypertension  . Palpitations    could not get a full breath, started 4 days ago, did not have any today   . Medication Refill    amlodipine    The patient is here for routine follow up. Started having palpitations and some shortness of breath on Saturday and Sunday. Denies chest pain or pressure or shortness of breath. States that she has not felt any of these symptoms today. Same thing happened last year. She had nonspecific t wave abnormality on ECG. Was seen in ER. Started on metoprolol. Took the thirty day prescription then stopped. Continues to take amlodipine and olmesartan. She has not been seen since incident last year.  Palpable, non tender, enlarged cervical lymph node, left side of neck, just in front of left ear.       Current Medication: Outpatient Encounter Medications as of 04/24/2019  Medication Sig  . amLODipine (NORVASC) 10 MG tablet Take 1 tablet (10 mg total) by mouth daily.  . olmesartan (BENICAR) 5 MG tablet TAKE 1 TABLET BY MOUTH  DAILY  . [DISCONTINUED] amLODipine (NORVASC) 10 MG tablet TAKE 1 TABLET BY MOUTH  DAILY  . [DISCONTINUED] metoprolol tartrate (LOPRESSOR) 25 MG tablet Take 0.5 tablets (12.5 mg total) by mouth 2 (two) times daily for 30 days.  . [DISCONTINUED] metoprolol tartrate (LOPRESSOR) 25 MG tablet Take 0.5 tablets (12.5 mg total) by mouth 2 (two) times daily as needed.   No facility-administered encounter medications on file as of 04/24/2019.    Surgical History: Past Surgical History:  Procedure Laterality Date  . CHOLECYSTECTOMY    . TUBAL LIGATION      Medical History: Past Medical History:  Diagnosis Date  . Abnormal uterine bleeding   . Bursitis   . Congestive heart failure (CHF) ([facility])   . Family  history of breast cancer 04/19/2012   IBIS 22.29%  . GERD (gastroesophageal reflux disease)   . Hypertension   . Increased risk of breast cancer 04/19/2012   IBIS 22.29  . Vitamin D deficiency     Family History: Family History  Problem Relation Age of Onset  . Diabetes Mother   . Hypertension Mother   . Breast cancer Mother        23   . Osteoporosis Mother   . Glaucoma Mother   . Osteoporosis Father   . Heart disease Father   . Hypertension Father   . Hypertension Brother   . Diabetes Maternal Aunt   . Breast cancer Paternal Aunt 10       again at 12  . Diabetes Paternal Aunt     Social History   Socioeconomic History  . Marital status: Married    Spouse name: Not on file  . Number of children: 2  . Years of education: Not on file  . Highest education level: Not on file  Occupational History  . Occupation: A/R rep    Employer: [employer]  Tobacco Use  . Smoking status: Never Smoker  . Smokeless tobacco: Never Used  Substance and Sexual Activity  . Alcohol use: No  . Drug use: No  . Sexual activity: Yes    Birth control/protection: Surgical  Other Topics Concern  . Not on file  Social History Narrative  . Not on file  Social Determinants of Health   Financial Resource Strain:   . Difficulty of Paying Living Expenses:   Food Insecurity:   . Worried About Charity fundraiser in the Last Year:   . Arboriculturist in the Last Year:   Transportation Needs:   . Film/video editor (Medical):   Marland Kitchen Lack of Transportation (Non-Medical):   Physical Activity:   . Days of Exercise per Week:   . Minutes of Exercise per Session:   Stress:   . Feeling of Stress :   Social Connections:   . Frequency of Communication with Friends and Family:   . Frequency of Social Gatherings with Friends and Family:   . Attends Religious Services:   . Active Member of Clubs or Organizations:   . Attends Archivist Meetings:   Marland Kitchen Marital Status:   Intimate Partner  Violence:   . Fear of Current or Ex-Partner:   . Emotionally Abused:   Marland Kitchen Physically Abused:   . Sexually Abused:       Review of Systems  Constitutional: Positive for fatigue. Negative for activity change, chills and unexpected weight change.  HENT: Negative for congestion, postnasal drip, rhinorrhea, sneezing and sore throat.   Respiratory: Negative for cough, chest tightness, shortness of breath and wheezing.   Cardiovascular: Negative for chest pain and palpitations.       Episode of palpitations over the weekend which resulted in shortness of breath and chest discomfort. Was seen in ER and started on metoprolol.   Gastrointestinal: Negative for abdominal pain, constipation, diarrhea, nausea and vomiting.  Endocrine: Negative for cold intolerance, heat intolerance, polydipsia and polyuria.  Musculoskeletal: Negative for arthralgias, back pain, joint swelling and neck pain.  Skin: Negative for rash.  Allergic/Immunologic: Negative for environmental allergies.  Neurological: Negative for dizziness, tremors, numbness and headaches.  Hematological: Negative for adenopathy. Does not bruise/bleed easily.  Psychiatric/Behavioral: Negative for behavioral problems (Depression), sleep disturbance and suicidal ideas. The patient is not nervous/anxious.     Today's Vitals   04/24/19 1510  BP: 122/84  Pulse: 87  Resp: 16  Temp: (!) 97.4 F (36.3 C)  SpO2: 98%  Weight: 150 lb 9.6 oz (68.3 kg)  Height: 5\' 3"  (1.6 m)   Body mass index is 26.68 kg/m.  Physical Exam Vitals and nursing note reviewed.  Constitutional:      General: She is not in acute distress.    Appearance: Normal appearance. She is well-developed. She is not diaphoretic.  HENT:     Head: Normocephalic and atraumatic.     Nose: Nose normal.     Mouth/Throat:     Pharynx: No oropharyngeal exudate.  Eyes:     Pupils: Pupils are equal, round, and reactive to light.  Neck:     Thyroid: No thyromegaly.     Vascular:  No carotid bruit or JVD.     Trachea: No tracheal deviation.   Cardiovascular:     Rate and Rhythm: Normal rate and regular rhythm.     Heart sounds: Normal heart sounds. No murmur. No friction rub. No gallop.      Comments: ECG today is within normal limits Pulmonary:     Effort: Pulmonary effort is normal. No respiratory distress.     Breath sounds: Normal breath sounds. No wheezing or rales.  Chest:     Chest wall: No tenderness.  Abdominal:     General: Bowel sounds are normal.     Palpations: Abdomen is soft.  Musculoskeletal:        General: Normal range of motion.     Cervical back: Normal range of motion and neck supple.  Lymphadenopathy:     Cervical: Cervical adenopathy present.  Skin:    General: Skin is warm and dry.  Neurological:     Mental Status: She is alert and oriented to person, place, and time.     Cranial Nerves: No cranial nerve deficit.  Psychiatric:        Behavior: Behavior normal.        Thought Content: Thought content normal.        Judgment: Judgment normal.   Assessment/Plan: 1. Palpitations ECG within normal limits today. Will have her hold metoprolol and use on as needed basis only.  - EKG 12-Lead  2. Essential hypertension Stable. Continue amlodipine as prescribed.  - amLODipine (NORVASC) 10 MG tablet; Take 1 tablet (10 mg total) by mouth daily.  Dispense: 90 tablet; Refill: 3  3. Enlarged lymph node in neck Will get ultrasound of the left side of neck for further evaluation. Refer as indicated.  - US Soft Tissue Head/Neck (NON-THYROID); Future  General Counseling: kenosha gonce understanding of the findings of todays visit and agrees with plan of treatment. I have discussed any further diagnostic evaluation that may be needed or ordered today. We also reviewed her medications today. she has been encouraged to call the office with any questions or concerns that should arise related to todays visit.  Hypertension Counseling:   The  following hypertensive lifestyle modification were recommended and discussed:  1. Limiting alcohol intake to less than 1 oz/day of ethanol:(24 oz of beer or 8 oz of wine or 2 oz of 100-proof whiskey). 2. Take baby ASA 81 mg daily. 3. Importance of regular aerobic exercise and losing weight. 4. Reduce dietary saturated fat and cholesterol intake for overall cardiovascular health. 5. Maintaining adequate dietary potassium, calcium, and magnesium intake. 6. Regular monitoring of the blood pressure. 7. Reduce sodium intake to less than 100 mmol/day (less than 2.3 gm of sodium or less than 6 gm of sodium choride)   This patient was seen by [place] with Dr Lavera Guise as a part of collaborative care agreement  Orders Placed This Encounter  Procedures  . US Soft Tissue Head/Neck (NON-THYROID)  . EKG 12-Lead    Meds ordered this encounter  Medications  . amLODipine (NORVASC) 10 MG tablet    Sig: Take 1 tablet (10 mg total) by mouth daily.    Dispense:  90 tablet    Refill:  3    Order Specific Question:   Supervising Provider    Answer:   Lavera Guise [id number]  . DISCONTD: metoprolol tartrate (LOPRESSOR) 25 MG tablet    Sig: Take 0.5 tablets (12.5 mg total) by mouth 2 (two) times daily as needed.    Dispense:  30 tablet    Refill:  2    Order Specific Question:   Supervising Provider    Answer:   Lavera Guise [id number]    Total time spent: 30 Minutes   Time spent includes review of chart, medications, test results, and follow up plan with the patient.      Dr Lavera Guise Internal medicine

## 2019-05-02 DIAGNOSIS — R590 Localized enlarged lymph nodes: Secondary | ICD-10-CM | POA: Insufficient documentation

## 2019-05-04 ENCOUNTER — Other Ambulatory Visit: Payer: Self-pay

## 2019-05-04 ENCOUNTER — Ambulatory Visit: Payer: Managed Care, Other (non HMO)

## 2019-05-04 ENCOUNTER — Other Ambulatory Visit: Payer: Self-pay | Admitting: Nurse Practitioner

## 2019-05-04 DIAGNOSIS — R590 Localized enlarged lymph nodes: Secondary | ICD-10-CM

## 2019-05-05 LAB — COMPREHENSIVE METABOLIC PANEL WITH GFR
ALT: 9 IU/L (ref 0–32)
AST: 17 IU/L (ref 0–40)
Albumin/Globulin Ratio: 1.6 (ref 1.2–2.2)
Albumin: 4.4 g/dL (ref 3.8–4.9)
Alkaline Phosphatase: 90 IU/L (ref 39–117)
BUN/Creatinine Ratio: 12 (ref 9–23)
BUN: 11 mg/dL (ref 6–24)
Bilirubin Total: 0.2 mg/dL (ref 0.0–1.2)
CO2: 23 mmol/L (ref 20–29)
Calcium: 9.3 mg/dL (ref 8.7–10.2)
Chloride: 102 mmol/L (ref 96–106)
Creatinine, Ser: 0.95 mg/dL (ref 0.57–1.00)
GFR calc Af Amer: 80 mL/min/1.73 (ref >59)
GFR calc non Af Amer: 69 mL/min/1.73 (ref >59)
Globulin, Total: 2.8 g/dL (ref 1.5–4.5)
Glucose: 83 mg/dL (ref 65–99)
Potassium: 4.6 mmol/L (ref 3.5–5.2)
Sodium: 141 mmol/L (ref 134–144)
Total Protein: 7.2 g/dL (ref 6.0–8.5)

## 2019-05-05 LAB — VITAMIN D 25 HYDROXY (VIT D DEFICIENCY, FRACTURES): Vit D, 25-Hydroxy: 9.3 ng/mL — ABNORMAL LOW (ref 30.0–100.0)

## 2019-05-05 LAB — LIPID PANEL W/O CHOL/HDL RATIO
Cholesterol, Total: 214 mg/dL — ABNORMAL HIGH (ref 100–199)
HDL: 67 mg/dL (ref >39)
LDL Chol Calc (NIH): 134 mg/dL — ABNORMAL HIGH (ref 0–99)
Triglycerides: 75 mg/dL (ref 0–149)
VLDL Cholesterol Cal: 13 mg/dL (ref 5–40)

## 2019-05-05 LAB — CBC
Hematocrit: 35.9 % (ref 34.0–46.6)
Hemoglobin: 12.2 g/dL (ref 11.1–15.9)
MCH: 30.3 pg (ref 26.6–33.0)
MCHC: 34 g/dL (ref 31.5–35.7)
MCV: 89 fL (ref 79–97)
Platelets: 271 10*3/uL (ref 150–450)
RBC: 4.02 x10E6/uL (ref 3.77–5.28)
RDW: 12.5 % (ref 11.7–15.4)
WBC: 5.6 10*3/uL (ref 3.4–10.8)

## 2019-05-05 LAB — TSH: TSH: 1.7 u[IU]/mL (ref 0.450–4.500)

## 2019-05-05 LAB — T4, FREE: Free T4: 1.13 ng/dL (ref 0.82–1.77)

## 2019-05-08 ENCOUNTER — Telehealth: Payer: Self-pay

## 2019-05-08 ENCOUNTER — Other Ambulatory Visit: Payer: Self-pay | Admitting: Nurse Practitioner

## 2019-05-08 DIAGNOSIS — R590 Localized enlarged lymph nodes: Secondary | ICD-10-CM

## 2019-05-08 MED ORDER — AMOXICILLIN 875 MG PO TABS: 875.0000 mg | ORAL_TABLET | Freq: Two times a day (BID) | ORAL | 0 refills | Status: DC

## 2019-05-08 NOTE — Telephone Encounter (Signed)
-----   Message from Ronnell Freshwater, NP sent at 05/08/2019  8:36 AM EDT ----- Please let the patient know that her Ultrasound of the neck is consistent with inflamed, reactive lymph node. Recommendation is to treat with antibiotics then reassess the lymph node. I have set prescription for amoxicillin 875mg  twice daily for 10 d ays and sent to [facility] on [address]. She should be nearly finished when I see her back in the office, and we can reassess the node then and see if ENT referral is necessary.  Thanks.

## 2019-05-08 NOTE — Progress Notes (Signed)
Ultrasound of the neck is consistent with inflamed, reactive lymph node. Recommendation is to treat with antibiotics then reassess the lymph node. I have set prescription for amoxicillin 875mg  twice daily for 10 days and sent to [facility] on [address]. She should be nearly finished when I see her back in the office, and we can reassess the node then and see if ENT referral is necessary.

## 2019-05-08 NOTE — Progress Notes (Signed)
Severe vitamin d deficiency. Discuss at visit 05/15/2019

## 2019-05-08 NOTE — Progress Notes (Signed)
Please let the patient know that her Ultrasound of the neck is consistent with inflamed, reactive lymph node. Recommendation is to treat with antibiotics then reassess the lymph node. I have set prescription for amoxicillin 875mg  twice daily for 10 days and sent to [facility] on [address]. She should be nearly finished when I see her back in the office, and we can reassess the node then and see if ENT referral is necessary.  Thanks.

## 2019-05-08 NOTE — Telephone Encounter (Signed)
Pt was notified.  

## 2019-05-10 ENCOUNTER — Telehealth: Payer: Self-pay

## 2019-05-10 NOTE — Telephone Encounter (Signed)
Confirmed and screened for 05-15-19 ov.

## 2019-05-15 ENCOUNTER — Other Ambulatory Visit: Payer: Self-pay

## 2019-05-15 ENCOUNTER — Ambulatory Visit: Payer: Managed Care, Other (non HMO) | Admitting: Nurse Practitioner

## 2019-05-15 ENCOUNTER — Encounter: Payer: Self-pay | Admitting: Nurse Practitioner

## 2019-05-15 VITALS — BP 120/77 | HR 88 | Temp 97.4°F | Resp 16 | Ht 63.0 in | Wt 152.0 lb

## 2019-05-15 DIAGNOSIS — M25511 Pain in right shoulder: Secondary | ICD-10-CM

## 2019-05-15 DIAGNOSIS — M1990 Unspecified osteoarthritis, unspecified site: Secondary | ICD-10-CM

## 2019-05-15 DIAGNOSIS — I10 Essential (primary) hypertension: Secondary | ICD-10-CM | POA: Diagnosis not present

## 2019-05-15 DIAGNOSIS — R590 Localized enlarged lymph nodes: Secondary | ICD-10-CM

## 2019-05-15 MED ORDER — MELOXICAM 7.5 MG PO TABS: 7.5000 mg | ORAL_TABLET | Freq: Every day | ORAL | 3 refills | Status: DC

## 2019-05-15 NOTE — Progress Notes (Signed)
[facility] [place], [place] [address]  Internal MEDICINE  Office Visit Note  Patient Name: Krystal Evans  [id number]  [id number]  Date of Service: 05/26/2019  Chief Complaint  Patient presents with  . Follow-up    thyroid    Patient has palpable lymph node on left side of the neck, just in front of the left ear. Had ultrasound of the node prior to this visit. The node is  measuring 1.3 cm at maximum diameter on ultrasound. Started her on empirical antibiotics. She has nearly completed course amoxicillin without reduction in size of palpable node, in fact, feels a bit larger to me today. It is non tender. It is not fixed. Contour is smooth.  She states she is starting to have some right shoulder pain as well as generalized joint pain. She has seen rheumatologist for this in the past. Was started on meloxicam and took this intermittently, when needed. States that this medication helped. Has not been back to see rheumatologist due to COVID 19 pandemic.       Current Medication: Outpatient Encounter Medications as of 05/15/2019  Medication Sig  . amLODipine (NORVASC) 10 MG tablet Take 1 tablet (10 mg total) by mouth daily.  Marland Kitchen amoxicillin (AMOXIL) 875 MG tablet Take 1 tablet (875 mg total) by mouth 2 (two) times daily.  . metoprolol tartrate (LOPRESSOR) 25 MG tablet Take 0.5 tablets (12.5 mg total) by mouth 2 (two) times daily as needed.  Marland Kitchen olmesartan (BENICAR) 5 MG tablet TAKE 1 TABLET BY MOUTH  DAILY  . meloxicam (MOBIC) 7.5 MG tablet Take 1 tablet (7.5 mg total) by mouth daily.   No facility-administered encounter medications on file as of 05/15/2019.    Surgical History: Past Surgical History:  Procedure Laterality Date  . CHOLECYSTECTOMY    . TUBAL LIGATION      Medical History: Past Medical History:  Diagnosis Date  . Abnormal uterine bleeding   . Bursitis   . Congestive heart failure (CHF) ([place])   . Family history of breast cancer 04/19/2012    IBIS 22.29%  . GERD (gastroesophageal reflux disease)   . Hypertension   . Increased risk of breast cancer 04/19/2012   IBIS 22.29  . Vitamin D deficiency     Family History: Family History  Problem Relation Age of Onset  . Diabetes Mother   . Hypertension Mother   . Breast cancer Mother        63  . Osteoporosis Mother   . Glaucoma Mother   . Osteoporosis Father   . Heart disease Father   . Hypertension Father   . Hypertension Brother   . Diabetes Maternal Aunt   . Breast cancer Paternal Aunt 23       again at 38  . Diabetes Paternal Aunt     Social History   Socioeconomic History  . Marital status: Married    Spouse name: Not on file  . Number of children: 2  . Years of education: Not on file  . Highest education level: Not on file  Occupational History  . Occupation: A/R rep    Employer: [employer]  Tobacco Use  . Smoking status: Never Smoker  . Smokeless tobacco: Never Used  Substance and Sexual Activity  . Alcohol use: No  . Drug use: No  . Sexual activity: Yes    Birth control/protection: Surgical  Other Topics Concern  . Not on file  Social History Narrative  . Not on file  Social Determinants of Health   Financial Resource Strain:   . Difficulty of Paying Living Expenses:   Food Insecurity:   . Worried About Charity fundraiser in the Last Year:   . Arboriculturist in the Last Year:   Transportation Needs:   . Film/video editor (Medical):   Marland Kitchen Lack of Transportation (Non-Medical):   Physical Activity:   . Days of Exercise per Week:   . Minutes of Exercise per Session:   Stress:   . Feeling of Stress :   Social Connections:   . Frequency of Communication with Friends and Family:   . Frequency of Social Gatherings with Friends and Family:   . Attends Religious Services:   . Active Member of Clubs or Organizations:   . Attends Archivist Meetings:   Marland Kitchen Marital Status:   Intimate Partner Violence:   . Fear of Current or  Ex-Partner:   . Emotionally Abused:   Marland Kitchen Physically Abused:   . Sexually Abused:       Review of Systems  Constitutional: Negative for activity change, chills, fatigue and unexpected weight change.  HENT: Negative for congestion, postnasal drip, rhinorrhea, sneezing and sore throat.   Respiratory: Negative for cough, chest tightness, shortness of breath and wheezing.   Cardiovascular: Negative for chest pain and palpitations.  Gastrointestinal: Negative for abdominal pain, constipation, diarrhea, nausea and vomiting.  Endocrine: Negative for cold intolerance, heat intolerance, polydipsia and polyuria.  Musculoskeletal: Negative for arthralgias, back pain, joint swelling and neck pain.  Skin: Negative for rash.  Allergic/Immunologic: Negative for environmental allergies.  Neurological: Negative for dizziness, tremors, numbness and headaches.  Hematological: Positive for adenopathy. Does not bruise/bleed easily.       There is 1.3cm lymph node present on right side of the neck. Non tender.   Psychiatric/Behavioral: Negative for behavioral problems (Depression), sleep disturbance and suicidal ideas. The patient is not nervous/anxious.     Today's Vitals   05/15/19 1100  BP: 120/77  Pulse: 88  Resp: 16  Temp: (!) 97.4 F (36.3 C)  SpO2: 98%  Weight: 152 lb (68.9 kg)  Height: 5\' 3"  (1.6 m)   Body mass index is 26.93 kg/m.  Physical Exam Vitals and nursing note reviewed.  Constitutional:      General: She is not in acute distress.    Appearance: Normal appearance. She is well-developed. She is not diaphoretic.  HENT:     Head: Normocephalic and atraumatic.     Nose: Nose normal.     Mouth/Throat:     Pharynx: No oropharyngeal exudate.  Eyes:     Pupils: Pupils are equal, round, and reactive to light.  Neck:     Thyroid: No thyromegaly.     Vascular: No carotid bruit or JVD.     Trachea: No tracheal deviation.   Cardiovascular:     Rate and Rhythm: Normal rate and  regular rhythm.     Heart sounds: Normal heart sounds. No murmur. No friction rub. No gallop.   Pulmonary:     Effort: Pulmonary effort is normal. No respiratory distress.     Breath sounds: Normal breath sounds. No wheezing or rales.  Chest:     Chest wall: No tenderness.  Abdominal:     Palpations: Abdomen is soft.  Musculoskeletal:        General: Normal range of motion.     Cervical back: Normal range of motion and neck supple.  Lymphadenopathy:  Cervical: Cervical adenopathy present.  Skin:    General: Skin is warm and dry.  Neurological:     Mental Status: She is alert and oriented to person, place, and time.     Cranial Nerves: No cranial nerve deficit.  Psychiatric:        Behavior: Behavior normal.        Thought Content: Thought content normal.        Judgment: Judgment normal.    Assessment/Plan: 1. Enlarged lymph node in neck Reviewed reslts of ultrasound. Showed 1.3 cm lymph node on left side of the neck. Unchanged after treatment with antibiotics. Will refer to ENT for further evaluation.  - Ambulatory referral to ENT  2. Right shoulder pain, unspecified chronicity Meloxicam 7.5mg  daily as needed for pain/inflammation.  - meloxicam (MOBIC) 7.5 MG tablet; Take 1 tablet (7.5 mg total) by mouth daily.  Dispense: 30 tablet; Refill: 3  3. Inflammatory osteoarthritis Meloxicam 7.5mg  daily as needed for pain/inflammation.  - meloxicam (MOBIC) 7.5 MG tablet; Take 1 tablet (7.5 mg total) by mouth daily.  Dispense: 30 tablet; Refill: 3  4. Essential hypertension Stable. Continue bp medication as prescribed   General Counseling: Elane verbalizes understanding of the findings of todays visit and agrees with plan of treatment. I have discussed any further diagnostic evaluation that may be needed or ordered today. We also reviewed her medications today. she has been encouraged to call the office with any questions or concerns that should arise related to todays  visit.  This patient was seen by Leretha Pol FNP Collaboration with Dr Lavera Guise as a part of collaborative care agreement  Orders Placed This Encounter  Procedures  . Ambulatory referral to ENT    Meds ordered this encounter  Medications  . meloxicam (MOBIC) 7.5 MG tablet    Sig: Take 1 tablet (7.5 mg total) by mouth daily.    Dispense:  30 tablet    Refill:  3    Order Specific Question:   Supervising Provider    Answer:   Lavera Guise [id number]    Total time spent: 30 Minutes   Time spent includes review of chart, medications, test results, and follow up plan with the patient.      Dr Lavera Guise Internal medicine

## 2019-06-19 ENCOUNTER — Other Ambulatory Visit: Payer: Self-pay | Admitting: Otolaryngology

## 2019-06-19 DIAGNOSIS — R221 Localized swelling, mass and lump, neck: Secondary | ICD-10-CM

## 2019-06-19 NOTE — Progress Notes (Signed)
Patient on schedule for parotid biopsy 06/21/2019, spoke with patient on phone with pre procedure instructions given to be here @ 0730, NPO after [place] and have driver for home after discharge in case she is having sedation. Stated understanding.

## 2019-06-20 ENCOUNTER — Other Ambulatory Visit: Payer: Self-pay | Admitting: Radiology

## 2019-06-21 ENCOUNTER — Other Ambulatory Visit: Payer: Self-pay

## 2019-06-21 ENCOUNTER — Ambulatory Visit
Admission: RE | Admit: 2019-06-21 | Discharge: 2019-06-21 | Disposition: A | Discharge status: Home / Self Care | Payer: Managed Care, Other (non HMO) | Source: Ambulatory Visit | Attending: Otolaryngology | Admitting: Otolaryngology

## 2019-06-21 DIAGNOSIS — K118 Other diseases of salivary glands: Secondary | ICD-10-CM | POA: Diagnosis present

## 2019-06-21 DIAGNOSIS — D37030 Neoplasm of uncertain behavior of the parotid salivary glands: Secondary | ICD-10-CM | POA: Diagnosis not present

## 2019-06-21 DIAGNOSIS — R221 Localized swelling, mass and lump, neck: Secondary | ICD-10-CM

## 2019-06-21 NOTE — Discharge Instructions (Signed)

## 2019-06-21 NOTE — Procedures (Signed)
Interventional Radiology Procedure Note  Procedure: US Guided Biopsy of left parotid lesion  Complications: None  Estimated Blood Loss: < 10 mL  Findings: 25 G FNA x 4 of 1.1 cm left parotid lesion.  Venetia Night. Kathlene Cote, M.D Pager:  [phone]

## 2019-06-26 LAB — CYTOLOGY - NON PAP

## 2019-06-27 ENCOUNTER — Other Ambulatory Visit: Payer: Self-pay | Admitting: Otolaryngology

## 2019-06-27 DIAGNOSIS — R221 Localized swelling, mass and lump, neck: Secondary | ICD-10-CM

## 2019-07-06 ENCOUNTER — Other Ambulatory Visit: Payer: Self-pay

## 2019-07-06 ENCOUNTER — Ambulatory Visit
Admission: RE | Admit: 2019-07-06 | Discharge: 2019-07-06 | Disposition: A | Discharge status: Home / Self Care | Payer: Managed Care, Other (non HMO) | Source: Ambulatory Visit | Attending: Otolaryngology | Admitting: Otolaryngology

## 2019-07-06 DIAGNOSIS — R221 Localized swelling, mass and lump, neck: Secondary | ICD-10-CM

## 2019-07-06 LAB — POCT I-STAT CREATININE: Creatinine, Ser: 0.9 mg/dL (ref 0.44–1.00)

## 2019-07-06 MED ORDER — IOHEXOL 300 MG/ML  SOLN
75.0000 mL | Freq: Once | INTRAMUSCULAR | Status: AC | PRN
  Administered 2019-07-06: 75 mL via INTRAVENOUS

## 2019-07-18 ENCOUNTER — Telehealth: Payer: Self-pay

## 2019-07-18 NOTE — Telephone Encounter (Signed)
Medical clearance signed by provider and faxed back to [facility]. Placed in scan.

## 2019-08-10 ENCOUNTER — Encounter
Admission: RE | Admit: 2019-08-10 | Discharge: 2019-08-10 | Disposition: A | Discharge status: Home / Self Care | Payer: 59 | Source: Ambulatory Visit | Attending: Otolaryngology | Admitting: Otolaryngology

## 2019-08-10 ENCOUNTER — Other Ambulatory Visit: Payer: Self-pay

## 2019-08-10 NOTE — Patient Instructions (Signed)
COVID TESTING Date: August 17, 2019 FRIDAY Testing site:  [facility] [address] Thru Hours:  7:34 am - 1:00 pm Once you are tested, you are asked to stay quarantined (avoiding public places) until after your surgery.   Your procedure is scheduled on: Tuesday August 21, 2019 Report to Day Surgery on the 2nd floor of the [facility]. To find out your arrival time, please call [phone] between 1PM - 3PM on: August 20, 2019  REMEMBER: Instructions that are not followed completely may result in serious medical risk, up to and including death; or upon the discretion of your surgeon and anesthesiologist your surgery may need to be rescheduled.  Do not eat food after midnight the night before surgery.  No gum chewing, lozengers or hard candies.  You may however, drink CLEAR liquids up to 2 hours before you are scheduled to arrive for your surgery. Do not drink anything within 2 hours of your scheduled arrival time.  Clear liquids include: - water  - apple juice without pulp - gatorade (not RED) - black coffee or tea (Do NOT add milk or creamers to the coffee or tea) Do NOT drink anything that is not on this list.  Type 1 and Type 2 diabetics should only drink water.   TAKE THESE MEDICATIONS THE MORNING OF SURGERY WITH A SIP OF WATER: NONE   Follow recommendations from Cardiologist, Pulmonologist or PCP regarding stopping Aspirin, Coumadin, Plavix, Eliquis, Pradaxa, or Pletal.  Stop Anti-inflammatories (NSAIDS) such as Advil, Aleve, Ibuprofen, Motrin, Naproxen, Naprosyn and Aspirin based products such as Excedrin, Goodys Powder, BC Powder. STOP MELOXICAM - NONE AFTER August 13, 2019 (May take Tylenol or Acetaminophen if needed.)  Stop ANY OVER THE COUNTER supplements until after surgery. (May continue Vitamin D, Vitamin B, and multivitamin.)  No Alcohol for 24 hours before or after surgery.  No Smoking including e-cigarettes for 24 hours prior to  surgery.  No chewable tobacco products for at least 6 hours prior to surgery.  No nicotine patches on the day of surgery.  Do not use any "recreational" drugs for at least a week prior to your surgery.  Please be advised that the combination of cocaine and anesthesia may have negative outcomes, up to and including death. If you test positive for cocaine, your surgery will be cancelled.  On the morning of surgery brush your teeth with toothpaste and water, you may rinse your mouth with mouthwash if you wish. Do not swallow any toothpaste or mouthwash.  Do not wear jewelry, make-up, hairpins, clips or nail polish.  Do not wear lotions, powders, or perfumes.   Do not shave 48 hours prior to surgery.   Contact lenses, hearing aids and dentures may not be worn into surgery.  Do not bring valuables to the hospital. [facility] is not responsible for any missing/lost belongings or valuables.   SHOWER DAY SURGERY  Notify your doctor if there is any change in your medical condition (cold, fever, infection).  Wear comfortable clothing (specific to your surgery type) to the hospital.  Plan for stool softeners for home use; pain medications have a tendency to cause constipation. You can also help prevent constipation by eating foods high in fiber such as fruits and vegetables and drinking plenty of fluids as your diet allows.  After surgery, you can help prevent lung complications by doing breathing exercises.  Take deep breaths and cough every 1-2 hours. Your doctor may order a device called an  Incentive Spirometer to help you take deep breaths. When coughing or sneezing, hold a pillow firmly against your incision with both hands. This is called "splinting." Doing this helps protect your incision. It also decreases belly discomfort.  If you are being discharged the day of surgery, you will not be allowed to drive home. You will need a responsible adult (18 years or older) to drive you home  and stay with you that night.   If you are taking public transportation, you will need to have a responsible adult (18 years or older) with you. Please confirm with your physician that it is acceptable to use public transportation.   Please call the [facility]. at [phone] if you have any questions about these instructions.  Visitation Policy:  Patients undergoing a surgery or procedure may have one family member or support person with them as long as that person is not COVID-19 positive or experiencing its symptoms.  That person may remain in the waiting area during the procedure.  Children under 29 years of age may have both parents or legal guardians with them during their procedure.  Inpatient Visitation Update:   Two designated support people may visit a patient during visiting hours 7 am to 8 pm. It must be the same two designated people for the duration of the patient stay. The visitors may come and go during the day, and there is no switching out to have different visitors. A mask must be worn at all times, including in the patient room.  Children under 66 years of age:  a total of 4 designated visitors for the child's entire stay are allowed. Only 2 in the room at a time and only one staying overnight at a time. The overnight guest can now rotate during the child's hospital stay.  As a reminder, masks are still required for all [facility] team members, patients and visitors in all [facility] facilities.   Systemwide, no visitors 17 or younger.

## 2019-08-17 ENCOUNTER — Other Ambulatory Visit: Payer: Self-pay

## 2019-08-17 ENCOUNTER — Other Ambulatory Visit
Admission: RE | Admit: 2019-08-17 | Discharge: 2019-08-17 | Disposition: A | Discharge status: Home / Self Care | Payer: 59 | Source: Ambulatory Visit | Attending: Otolaryngology | Admitting: Otolaryngology

## 2019-08-17 DIAGNOSIS — Z20822 Contact with and (suspected) exposure to covid-19: Secondary | ICD-10-CM | POA: Insufficient documentation

## 2019-08-17 DIAGNOSIS — Z01812 Encounter for preprocedural laboratory examination: Secondary | ICD-10-CM | POA: Insufficient documentation

## 2019-08-18 LAB — SARS CORONAVIRUS 2 (TAT 6-24 HRS): SARS Coronavirus 2: NEGATIVE

## 2019-08-21 ENCOUNTER — Encounter
Admission: RE | Disposition: A | Discharge status: Home / Self Care | Payer: Self-pay | Source: Home / Self Care | Attending: Otolaryngology

## 2019-08-21 ENCOUNTER — Encounter: Payer: Self-pay | Admitting: Otolaryngology

## 2019-08-21 ENCOUNTER — Other Ambulatory Visit: Payer: Self-pay

## 2019-08-21 ENCOUNTER — Ambulatory Visit: Payer: Managed Care, Other (non HMO) | Admitting: Registered Nurse

## 2019-08-21 ENCOUNTER — Ambulatory Visit
Admission: RE | Admit: 2019-08-21 | Discharge: 2019-08-21 | Disposition: A | Discharge status: Home / Self Care | Payer: Managed Care, Other (non HMO) | Attending: Otolaryngology | Admitting: Otolaryngology

## 2019-08-21 DIAGNOSIS — K119 Disease of salivary gland, unspecified: Secondary | ICD-10-CM | POA: Diagnosis present

## 2019-08-21 DIAGNOSIS — I110 Hypertensive heart disease with heart failure: Secondary | ICD-10-CM | POA: Diagnosis not present

## 2019-08-21 DIAGNOSIS — D110 Benign neoplasm of parotid gland: Secondary | ICD-10-CM | POA: Insufficient documentation

## 2019-08-21 DIAGNOSIS — I509 Heart failure, unspecified: Secondary | ICD-10-CM | POA: Insufficient documentation

## 2019-08-21 DIAGNOSIS — Z79899 Other long term (current) drug therapy: Secondary | ICD-10-CM | POA: Insufficient documentation

## 2019-08-21 HISTORY — PX: CONTINUOUS NERVE MONITORING: SHX6650

## 2019-08-21 HISTORY — PX: PAROTIDECTOMY: SHX2163

## 2019-08-21 LAB — POCT PREGNANCY, URINE: Preg Test, Ur: NEGATIVE

## 2019-08-21 SURGERY — EXCISION, PAROTID GLAND
Anesthesia: General

## 2019-08-21 MED ORDER — LIDOCAINE HCL (PF) 2 % IJ SOLN
INTRAMUSCULAR | Status: AC
  Filled 2019-08-21: qty 5

## 2019-08-21 MED ORDER — FAMOTIDINE 20 MG PO TABS
ORAL_TABLET | ORAL | Status: AC
  Administered 2019-08-21: 20 mg via ORAL
  Filled 2019-08-21: qty 1

## 2019-08-21 MED ORDER — PROPOFOL 10 MG/ML IV BOLUS
INTRAVENOUS | Status: AC
  Filled 2019-08-21: qty 20

## 2019-08-21 MED ORDER — ACETAMINOPHEN 10 MG/ML IV SOLN
INTRAVENOUS | Status: AC
  Filled 2019-08-21: qty 100

## 2019-08-21 MED ORDER — SUCCINYLCHOLINE CHLORIDE 200 MG/10ML IV SOSY
PREFILLED_SYRINGE | INTRAVENOUS | Status: AC
  Filled 2019-08-21: qty 10

## 2019-08-21 MED ORDER — DEXAMETHASONE SODIUM PHOSPHATE 10 MG/ML IJ SOLN
INTRAMUSCULAR | Status: AC
  Filled 2019-08-21: qty 1

## 2019-08-21 MED ORDER — SODIUM CHLORIDE 0.9 % IV SOLN: INTRAVENOUS | Status: DC

## 2019-08-21 MED ORDER — ACETAMINOPHEN 10 MG/ML IV SOLN
INTRAVENOUS | Status: DC | PRN
  Administered 2019-08-21: 1000 mg via INTRAVENOUS

## 2019-08-21 MED ORDER — LACTATED RINGERS IV SOLN: INTRAVENOUS | Status: DC

## 2019-08-21 MED ORDER — FENTANYL CITRATE (PF) 100 MCG/2ML IJ SOLN
INTRAMUSCULAR | Status: DC | PRN
  Administered 2019-08-21 (×2): 50 ug via INTRAVENOUS

## 2019-08-21 MED ORDER — ONDANSETRON HCL 4 MG/2ML IJ SOLN
INTRAMUSCULAR | Status: AC
  Filled 2019-08-21: qty 2

## 2019-08-21 MED ORDER — PROPOFOL 10 MG/ML IV BOLUS
INTRAVENOUS | Status: DC | PRN
  Administered 2019-08-21: 170 mg via INTRAVENOUS

## 2019-08-21 MED ORDER — FAMOTIDINE 20 MG PO TABS: 20.0000 mg | ORAL_TABLET | Freq: Once | ORAL | Status: AC

## 2019-08-21 MED ORDER — GLYCOPYRROLATE 0.2 MG/ML IJ SOLN
INTRAMUSCULAR | Status: DC | PRN
  Administered 2019-08-21: .2 mg via INTRAVENOUS

## 2019-08-21 MED ORDER — PHENYLEPHRINE HCL (PRESSORS) 10 MG/ML IV SOLN
INTRAVENOUS | Status: DC | PRN
  Administered 2019-08-21: 100 ug via INTRAVENOUS
  Administered 2019-08-21: 200 ug via INTRAVENOUS
  Administered 2019-08-21: 100 ug via INTRAVENOUS
  Administered 2019-08-21 (×2): 200 ug via INTRAVENOUS

## 2019-08-21 MED ORDER — FENTANYL CITRATE (PF) 100 MCG/2ML IJ SOLN
INTRAMUSCULAR | Status: AC
  Filled 2019-08-21: qty 2

## 2019-08-21 MED ORDER — CHLORHEXIDINE GLUCONATE 0.12 % MT SOLN
OROMUCOSAL | Status: AC
  Administered 2019-08-21: 15 mL via OROMUCOSAL
  Filled 2019-08-21: qty 15

## 2019-08-21 MED ORDER — ONDANSETRON HCL 4 MG/2ML IJ SOLN
INTRAMUSCULAR | Status: DC | PRN
  Administered 2019-08-21: 4 mg via INTRAVENOUS

## 2019-08-21 MED ORDER — PROMETHAZINE HCL 25 MG/ML IJ SOLN: 6.2500 mg | INTRAMUSCULAR | Status: DC | PRN

## 2019-08-21 MED ORDER — CHLORHEXIDINE GLUCONATE 0.12 % MT SOLN: 15.0000 mL | Freq: Once | OROMUCOSAL | Status: AC

## 2019-08-21 MED ORDER — ONDANSETRON HCL 4 MG PO TABS: 4.0000 mg | ORAL_TABLET | Freq: Three times a day (TID) | ORAL | 0 refills | Status: DC | PRN

## 2019-08-21 MED ORDER — GLYCOPYRROLATE 0.2 MG/ML IJ SOLN
INTRAMUSCULAR | Status: AC
  Filled 2019-08-21: qty 1

## 2019-08-21 MED ORDER — OXYCODONE-ACETAMINOPHEN 5-325 MG PO TABS: 1.0000 | ORAL_TABLET | ORAL | 0 refills | Status: DC | PRN

## 2019-08-21 MED ORDER — MIDAZOLAM HCL 2 MG/2ML IJ SOLN
INTRAMUSCULAR | Status: DC | PRN
  Administered 2019-08-21: 2 mg via INTRAVENOUS

## 2019-08-21 MED ORDER — PHENYLEPHRINE HCL (PRESSORS) 10 MG/ML IV SOLN
INTRAVENOUS | Status: AC
  Filled 2019-08-21: qty 1

## 2019-08-21 MED ORDER — ORAL CARE MOUTH RINSE: 15.0000 mL | Freq: Once | OROMUCOSAL | Status: AC

## 2019-08-21 MED ORDER — MIDAZOLAM HCL 2 MG/2ML IJ SOLN
INTRAMUSCULAR | Status: AC
  Filled 2019-08-21: qty 2

## 2019-08-21 MED ORDER — SUCCINYLCHOLINE CHLORIDE 20 MG/ML IJ SOLN
INTRAMUSCULAR | Status: DC | PRN
  Administered 2019-08-21: 100 mg via INTRAVENOUS

## 2019-08-21 MED ORDER — LIDOCAINE HCL (CARDIAC) PF 100 MG/5ML IV SOSY
PREFILLED_SYRINGE | INTRAVENOUS | Status: DC | PRN
  Administered 2019-08-21: 100 mg via INTRAVENOUS

## 2019-08-21 MED ORDER — FENTANYL CITRATE (PF) 100 MCG/2ML IJ SOLN: 25.0000 ug | INTRAMUSCULAR | Status: DC | PRN

## 2019-08-21 MED ORDER — LIDOCAINE-EPINEPHRINE 1 %-1:100000 IJ SOLN
INTRAMUSCULAR | Status: AC
  Filled 2019-08-21: qty 1

## 2019-08-21 MED ORDER — DEXAMETHASONE SODIUM PHOSPHATE 10 MG/ML IJ SOLN
INTRAMUSCULAR | Status: DC | PRN
  Administered 2019-08-21: 10 mg via INTRAVENOUS

## 2019-08-21 MED ORDER — LIDOCAINE-EPINEPHRINE 1 %-1:100000 IJ SOLN
INTRAMUSCULAR | Status: DC | PRN
  Administered 2019-08-21: 7 mL

## 2019-08-21 SURGICAL SUPPLY — 41 items
ADHESIVE MASTISOL STRL (MISCELLANEOUS) ×2 IMPLANT
BLADE SURG 15 STRL LF DISP TIS (BLADE) ×2 IMPLANT
BLADE SURG 15 STRL SS (BLADE) ×1
CANISTER SUCT 1200ML W/VALVE (MISCELLANEOUS) ×3 IMPLANT
CORD BIP STRL DISP 12FT (MISCELLANEOUS) ×3 IMPLANT
COTTON BALL STRL MEDIUM (GAUZE/BANDAGES/DRESSINGS) ×3 IMPLANT
COVER WAND RF STERILE (DRAPES) ×3 IMPLANT
DERMABOND ADVANCED (GAUZE/BANDAGES/DRESSINGS) ×1
DERMABOND ADVANCED .7 DNX12 (GAUZE/BANDAGES/DRESSINGS) ×2 IMPLANT
DRAIN TLS ROUND 10FR (DRAIN) ×2 IMPLANT
DRAPE MAG INST 16X20 L/F (DRAPES) ×3 IMPLANT
DRAPE SURG 17X11 SM STRL (DRAPES) ×3 IMPLANT
DRSG TEGADERM 2-3/8X2-3/4 SM (GAUZE/BANDAGES/DRESSINGS) ×9 IMPLANT
ELECT NEEDLE 20X.3 GREEN (MISCELLANEOUS) ×3
ELECT REM PT RETURN 9FT ADLT (ELECTROSURGICAL) ×3
ELECTRODE NDL 20X.3 GREEN (MISCELLANEOUS) ×2 IMPLANT
ELECTRODE NEEDLE 20X.3 GREEN (MISCELLANEOUS) ×2 IMPLANT
ELECTRODE REM PT RTRN 9FT ADLT (ELECTROSURGICAL) ×2 IMPLANT
FORCEPS JEWEL BIP 4-3/4 STR (INSTRUMENTS) ×3 IMPLANT
GAUZE 4X4 16PLY RFD (DISPOSABLE) ×3 IMPLANT
GLOVE BIO SURGEON STRL SZ7.5 (GLOVE) ×6 IMPLANT
GOWN STRL REUS W/ TWL LRG LVL3 (GOWN DISPOSABLE) ×6 IMPLANT
GOWN STRL REUS W/TWL LRG LVL3 (GOWN DISPOSABLE) ×3
HOOK STAY BLUNT/RETRACTOR 5M (MISCELLANEOUS) ×2 IMPLANT
JACKSON PRATT 7MM (INSTRUMENTS) ×2 IMPLANT
KIT TURNOVER KIT A (KITS) ×3 IMPLANT
LABEL OR SOLS (LABEL) ×2 IMPLANT
MARKER SKIN DUAL TIP RULER LAB (MISCELLANEOUS) ×3 IMPLANT
NS IRRIG 500ML POUR BTL (IV SOLUTION) ×3 IMPLANT
PACK HEAD/NECK (MISCELLANEOUS) ×3 IMPLANT
PROBE MONO 100X0.75 ELECT 1.9M (MISCELLANEOUS) ×3 IMPLANT
SHEARS HARMONIC 9CM CVD (BLADE) ×3 IMPLANT
SPONGE KITTNER 5P (MISCELLANEOUS) ×3 IMPLANT
STRIP CLOSURE SKIN 1/4X4 (GAUZE/BANDAGES/DRESSINGS) ×3 IMPLANT
SUT PROLENE 5 0 PS 3 (SUTURE) ×3 IMPLANT
SUT SILK 0 (SUTURE) ×1
SUT SILK 0 30XBRD TIE 6 (SUTURE) ×2 IMPLANT
SUT SILK 2 0 (SUTURE) ×1
SUT SILK 2-0 30XBRD TIE 12 (SUTURE) ×2 IMPLANT
SUT VIC AB 4-0 RB1 18 (SUTURE) ×3 IMPLANT
SYSTEM CHEST DRAIN TLS 7FR (DRAIN) ×2 IMPLANT

## 2019-08-21 NOTE — Discharge Instructions (Signed)

## 2019-08-21 NOTE — Transfer of Care (Signed)
Immediate Anesthesia Transfer of Care Note  Patient: Krystal Evans  Procedure(s) Performed: Procedure(s): PAROTIDECTOMY (Left) FACIAL NERVE MONITORING (N/A)  Patient Location: PACU  Anesthesia Type:General  Level of Consciousness: sedated  Airway & Oxygen Therapy: Patient Spontanous Breathing and Patient connected to face mask oxygen  Post-op Assessment: Report given to RN and Post -op Vital signs reviewed and stable  Post vital signs: Reviewed and stable  Last Vitals:  Vitals:   08/21/19 0611 08/21/19 0852  BP: (!) 134/93 119/82  Pulse: 97 99  Resp: 20 11  Temp: 36.6 C (!) 36.1 C  SpO2: 93% 24%    Complications: No apparent anesthesia complications

## 2019-08-21 NOTE — Anesthesia Preprocedure Evaluation (Signed)
Anesthesia Evaluation  Patient identified by MRN, date of birth, ID band Patient awake    Reviewed: Allergy & Precautions, H&P , NPO status , Patient's Chart, lab work & pertinent test results, reviewed documented beta blocker date and time   Airway Mallampati: I  TM Distance: >3 FB Neck ROM: full    Dental  (+) Dental Advidsory Given, Caps, Teeth Intact, Missing   Pulmonary neg pulmonary ROS,    Pulmonary exam normal breath sounds clear to auscultation       Cardiovascular Exercise Tolerance: Good hypertension, (-) angina+CHF (one episode after the delivery of her child, none since)  (-) Past MI and (-) Cardiac Stents Normal cardiovascular exam(-) dysrhythmias (-) Valvular Problems/Murmurs Rhythm:regular Rate:Normal     Neuro/Psych negative neurological ROS  negative psych ROS   GI/Hepatic Neg liver ROS, GERD  ,  Endo/Other  negative endocrine ROS  Renal/GU negative Renal ROS  negative genitourinary   Musculoskeletal   Abdominal   Peds  Hematology negative hematology ROS (+)   Anesthesia Other Findings Past Medical History: No date: Abnormal uterine bleeding No date: Bursitis No date: Congestive heart failure (CHF) ([place]) 04/19/2012: Family history of breast cancer     Comment:  IBIS 22.29% No date: GERD (gastroesophageal reflux disease) No date: Hypertension 04/19/2012: Increased risk of breast cancer     Comment:  IBIS 22.29 No date: Vitamin D deficiency   Reproductive/Obstetrics negative OB ROS                             Anesthesia Physical Anesthesia Plan  ASA: II  Anesthesia Plan: General   Post-op Pain Management:    Induction: Intravenous  PONV Risk Score and Plan: 3 and Ondansetron, Dexamethasone, Midazolam and Treatment may vary due to age or medical condition  Airway Management Planned: Oral ETT  Additional Equipment:   Intra-op Plan:   Post-operative Plan:  Extubation in OR  Informed Consent: I have reviewed the patients History and Physical, chart, labs and discussed the procedure including the risks, benefits and alternatives for the proposed anesthesia with the patient or authorized representative who has indicated his/her understanding and acceptance.     Dental Advisory Given  Plan Discussed with: Anesthesiologist, CRNA and Surgeon  Anesthesia Plan Comments:         Anesthesia Quick Evaluation

## 2019-08-21 NOTE — H&P (Signed)
..  History and Physical paper copy reviewed and updated date of procedure and will be scanned into system.  Patient seen and examined.  Left side marked.

## 2019-08-21 NOTE — Anesthesia Postprocedure Evaluation (Signed)
Anesthesia Post Note  Patient: Krystal Evans  Procedure(s) Performed: PAROTIDECTOMY (Left ) FACIAL NERVE MONITORING (N/A )  Patient location during evaluation: PACU Anesthesia Type: General Level of consciousness: awake and alert Pain management: pain level controlled Vital Signs Assessment: post-procedure vital signs reviewed and stable Respiratory status: spontaneous breathing, nonlabored ventilation, respiratory function stable and patient connected to nasal cannula oxygen Cardiovascular status: blood pressure returned to baseline and stable Postop Assessment: no apparent nausea or vomiting Anesthetic complications: no   No complications documented.   Last Vitals:  Vitals:   08/21/19 0929 08/21/19 0941  BP:  123/85  Pulse:  81  Resp:  16  Temp: (!) 36.2 C 36.4 C  SpO2:  99%    Last Pain:  Vitals:   08/21/19 0941  TempSrc: Temporal  PainSc: 0-No pain                 Martha Clan

## 2019-08-21 NOTE — Anesthesia Procedure Notes (Signed)
Procedure Name: Intubation Date/Time: 08/21/2019 7:41 AM Performed by: Doreen Salvage, CRNA Pre-anesthesia Checklist: Patient identified, Patient being monitored, Timeout performed, Emergency Drugs available and Suction available Patient Re-evaluated:Patient Re-evaluated prior to induction Oxygen Delivery Method: Circle system utilized Preoxygenation: Pre-oxygenation with 100% oxygen Induction Type: IV induction Ventilation: Mask ventilation without difficulty Laryngoscope Size: Mac, 3 and McGraph Grade View: Grade I Tube type: Oral Tube size: 7.0 mm Number of attempts: 1 Airway Equipment and Method: Stylet Placement Confirmation: ETT inserted through vocal cords under direct vision,  positive ETCO2 and breath sounds checked- equal and bilateral Secured at: 21 cm Tube secured with: Tape Dental Injury: Teeth and Oropharynx as per pre-operative assessment

## 2019-08-21 NOTE — Op Note (Signed)
..  08/21/2019  8:41 AM    Krystal Evans  [id number]   Pre-Op Dx: LEFT parotid mass  Post-op Dx: same  Proc: Left Superficial parotidectomy without facial nerve dissection  Surg: Krystal Evans  Asst:  McQueen  Anes:  GOT  EBL: <73ml  Comp:  none  Findings:  Inferior and superficial mass situated on superficial aspect of anterior tail of parotid.  Procedure:  After the patient was identified in hold and the history and physical and consent was reviewed and updated. The patient was marked in the normal fashion in an existing skin crease of his left neck just posterior and  2 finger lengths below the angle of the mandible. The patient was next taken to the operating room and placed in a supine position. General endotracheal anesthesia was induced in the normal fashion. The patient's right neck was neck injected with 7cc's of 1% lidocaine with 1:100,000 Epinephrine.  Facial Nerve Monitoring electrodes were placed in the normal fashion. The patient was next prepped and draped in a sterile normal fashion.  At this time, a 15 blade scalpel was used to make a skin incision along the previously marked neck crease in the left neck after the proper time out was performed. Dissection was carefully performed through the subcutaneous tissues with combination of Bovie electrocautery and blunt dissection. The platysma muscle was divided with harmonic scalpel.  A nerve stimulator was used to ensure no injury to the neural structures was made.  The mobile mass superficial and inferior the to parotid was palpated.  The fascia overlying the gland was divided and retracted superiorly with a Sin retractor.  Continued blunt and sharp dissection with Harmonic Scalpel was performed circumferentially around the mass.  The remaining attachments of the mass deeply were next carefully transected with the Harmonic scalpel again with taking care to avoid injury to neural structures.  The small attachements of  the mass to the parotid gland superiorly were carefully dissected and divided with bipolar.  The mass was sent for fresh examination.  At this time, the wound was copiously irrigated with sterile saline. Meticulous hemostasis with bipolar was obtained. The wound was then closed in a multilayered fashion with vicryl for subcutaneous tissues and dermabond for skin closure.  At this time the patient was extubated and taken to PACU in good condition.    Dispo: PACU in good condition.  Plan:  Ice, elevation, analgesia.  Krystal Evans 08/21/2019 8:41 AM

## 2019-08-21 NOTE — Progress Notes (Signed)
   08/21/19 0720  Clinical Encounter Type  Visited With Patient  Visit Type Initial  Referral From Chaplain  Consult/Referral To Chaplain  Chaplain briefly spoke with patient. Chaplain commented on patient's smile. She commented on how the masks hide our faces, but one can see that a person is smiling from something in their eyes. Before leaving, chaplain asked if she could pray with her and patient said yes. Chaplain prayed that God would guide doctor's hand and that patient would have a healthy recovery. Chaplain wished patient well and left.

## 2019-08-22 ENCOUNTER — Encounter: Payer: Self-pay | Admitting: Otolaryngology

## 2019-08-23 LAB — SURGICAL PATHOLOGY

## 2019-10-11 ENCOUNTER — Telehealth: Payer: Self-pay

## 2019-10-11 NOTE — Telephone Encounter (Signed)
Confirmed and screened for 10-16-19 ov.

## 2019-10-16 ENCOUNTER — Ambulatory Visit: Payer: Managed Care, Other (non HMO) | Admitting: Nurse Practitioner

## 2019-10-16 ENCOUNTER — Encounter: Payer: Self-pay | Admitting: Nurse Practitioner

## 2019-10-16 ENCOUNTER — Other Ambulatory Visit: Payer: Self-pay

## 2019-10-16 VITALS — BP 126/88 | HR 79 | Temp 97.7°F | Resp 16 | Ht 63.0 in | Wt 154.8 lb

## 2019-10-16 DIAGNOSIS — I10 Essential (primary) hypertension: Secondary | ICD-10-CM | POA: Diagnosis not present

## 2019-10-16 DIAGNOSIS — R002 Palpitations: Secondary | ICD-10-CM | POA: Diagnosis not present

## 2019-10-16 NOTE — Progress Notes (Signed)
[facility] [place], [place] [address]  Internal MEDICINE  Office Visit Note  Patient Name: Krystal Evans  [id number]  [id number]  Date of Service: 10/31/2019  Chief Complaint  Patient presents with  . Follow-up  . Hypertension  . Quality Metric Gaps    HIV screening, HepC    The patient is here for routine follow up. Blood pressure is well controlled. She continues to take amlodipine and olmesartan every day. She takes metoprolol as needed for rapid heart rate and palpitations. She states that she rarely has to take this. She has not experienced palpitations in some time. She denies chest pain, chest pressure, or shortness of breath. She has no new concerns or complaints today.       Current Medication: Outpatient Encounter Medications as of 10/16/2019  Medication Sig  . amLODipine (NORVASC) 10 MG tablet Take 1 tablet (10 mg total) by mouth daily. (Patient taking differently: Take 10 mg by mouth at bedtime. )  . meloxicam (MOBIC) 7.5 MG tablet Take 1 tablet (7.5 mg total) by mouth daily. (Patient taking differently: Take 7.5 mg by mouth daily as needed (pain.). )  . metoprolol tartrate (LOPRESSOR) 25 MG tablet Take 0.5 tablets (12.5 mg total) by mouth 2 (two) times daily as needed. (Patient taking differently: Take 12.5 mg by mouth 2 (two) times daily as needed (hypertension.). )  . olmesartan (BENICAR) 5 MG tablet TAKE 1 TABLET BY MOUTH  DAILY (Patient taking differently: Take 5 mg by mouth at bedtime. )  . ondansetron (ZOFRAN) 4 MG tablet Take 1 tablet (4 mg total) by mouth every 8 (eight) hours as needed for up to 10 doses for nausea or vomiting. (Patient not taking: Reported on 10/16/2019)  . oxyCODONE-acetaminophen (PERCOCET) 5-325 MG tablet Take 1 tablet by mouth every 4 (four) hours as needed for severe pain. (Patient not taking: Reported on 10/16/2019)   No facility-administered encounter medications on file as of 10/16/2019.    Surgical History: Past  Surgical History:  Procedure Laterality Date  . CHOLECYSTECTOMY    . CONTINUOUS NERVE MONITORING N/A 08/21/2019   Procedure: FACIAL NERVE MONITORING;  Surgeon: Carloyn Manner, MD;  Location: [facility] ORS;  Service: ENT;  Laterality: N/A;  . PAROTIDECTOMY Left 08/21/2019   Procedure: PAROTIDECTOMY;  Surgeon: Carloyn Manner, MD;  Location: [facility] ORS;  Service: ENT;  Laterality: Left;  . TUBAL LIGATION      Medical History: Past Medical History:  Diagnosis Date  . Abnormal uterine bleeding   . Bursitis   . Congestive heart failure (CHF) ([place])   . Family history of breast cancer 04/19/2012   IBIS 22.29%  . GERD (gastroesophageal reflux disease)   . Hypertension   . Increased risk of breast cancer 04/19/2012   IBIS 22.29  . Vitamin D deficiency     Family History: Family History  Problem Relation Age of Onset  . Diabetes Mother   . Hypertension Mother   . Breast cancer Mother        78  . Osteoporosis Mother   . Glaucoma Mother   . Osteoporosis Father   . Heart disease Father   . Hypertension Father   . Hypertension Brother   . Diabetes Maternal Aunt   . Breast cancer Paternal Aunt 51       again at 52  . Diabetes Paternal Aunt     Social History   Socioeconomic History  . Marital status: Married    Spouse name: Not on file  .  Number of children: 2  . Years of education: Not on file  . Highest education level: Not on file  Occupational History  . Occupation: A/R rep    Employer: [employer]  Tobacco Use  . Smoking status: Never Smoker  . Smokeless tobacco: Never Used  Vaping Use  . Vaping Use: Never used  Substance and Sexual Activity  . Alcohol use: No  . Drug use: No  . Sexual activity: Yes    Birth control/protection: Surgical  Other Topics Concern  . Not on file  Social History Narrative  . Not on file   Social Determinants of Health   Financial Resource Strain:   . Difficulty of Paying Living Expenses: Not on file  Food Insecurity:   . Worried  About Charity fundraiser in the Last Year: Not on file  . Ran Out of Food in the Last Year: Not on file  Transportation Needs:   . Lack of Transportation (Medical): Not on file  . Lack of Transportation (Non-Medical): Not on file  Physical Activity:   . Days of Exercise per Week: Not on file  . Minutes of Exercise per Session: Not on file  Stress:   . Feeling of Stress : Not on file  Social Connections:   . Frequency of Communication with Friends and Family: Not on file  . Frequency of Social Gatherings with Friends and Family: Not on file  . Attends Religious Services: Not on file  . Active Member of Clubs or Organizations: Not on file  . Attends Archivist Meetings: Not on file  . Marital Status: Not on file  Intimate Partner Violence:   . Fear of Current or Ex-Partner: Not on file  . Emotionally Abused: Not on file  . Physically Abused: Not on file  . Sexually Abused: Not on file      Review of Systems  Constitutional: Negative for activity change, chills, fatigue and unexpected weight change.  HENT: Negative for congestion, postnasal drip, rhinorrhea, sneezing and sore throat.   Respiratory: Negative for cough, chest tightness, shortness of breath and wheezing.   Cardiovascular: Negative for chest pain and palpitations.       Well managed blood pressure.   Gastrointestinal: Negative for abdominal pain, constipation, diarrhea, nausea and vomiting.  Endocrine: Negative for cold intolerance, heat intolerance, polydipsia and polyuria.  Musculoskeletal: Negative for arthralgias, back pain, joint swelling and neck pain.  Skin: Negative for rash.  Allergic/Immunologic: Negative for environmental allergies.  Neurological: Negative for dizziness, tremors, numbness and headaches.  Hematological: Negative for adenopathy. Does not bruise/bleed easily.  Psychiatric/Behavioral: Negative for behavioral problems (Depression), sleep disturbance and suicidal ideas. The patient is  not nervous/anxious.    Today's Vitals   10/16/19 1610  BP: 126/88  Pulse: 79  Resp: 16  Temp: 97.7 F (36.5 C)  SpO2: 92%  Weight: 154 lb 12.8 oz (70.2 kg)  Height: 5\' 3"  (1.6 m)   Body mass index is 27.42 kg/m.  Physical Exam Vitals and nursing note reviewed.  Constitutional:      General: She is not in acute distress.    Appearance: Normal appearance. She is well-developed. She is not diaphoretic.  HENT:     Head: Normocephalic and atraumatic.     Nose: Nose normal.     Mouth/Throat:     Pharynx: No oropharyngeal exudate.  Eyes:     Pupils: Pupils are equal, round, and reactive to light.  Neck:     Thyroid: No thyromegaly.  Vascular: No JVD.     Trachea: No tracheal deviation.  Cardiovascular:     Rate and Rhythm: Normal rate and regular rhythm.     Heart sounds: Normal heart sounds. No murmur heard.  No friction rub. No gallop.   Pulmonary:     Effort: Pulmonary effort is normal. No respiratory distress.     Breath sounds: Normal breath sounds. No wheezing or rales.  Chest:     Chest wall: No tenderness.  Abdominal:     Palpations: Abdomen is soft.  Musculoskeletal:        General: Normal range of motion.     Cervical back: Normal range of motion and neck supple.  Lymphadenopathy:     Cervical: No cervical adenopathy.  Skin:    General: Skin is warm and dry.  Neurological:     Mental Status: She is alert and oriented to person, place, and time.     Cranial Nerves: No cranial nerve deficit.  Psychiatric:        Mood and Affect: Mood normal.        Behavior: Behavior normal.        Thought Content: Thought content normal.        Judgment: Judgment normal.    Assessment/Plan: 1. Essential hypertension Stable. Continue bp medication as prescribed   2. Palpitations Take metoprolol as needed and as prescribed for palpitations.   General Counseling: velita quirk understanding of the findings of todays visit and agrees with plan of treatment. I  have discussed any further diagnostic evaluation that may be needed or ordered today. We also reviewed her medications today. she has been encouraged to call the office with any questions or concerns that should arise related to todays visit.  Hypertension Counseling:   The following hypertensive lifestyle modification were recommended and discussed:  1. Limiting alcohol intake to less than 1 oz/day of ethanol:(24 oz of beer or 8 oz of wine or 2 oz of 100-proof whiskey). 2. Take baby ASA 81 mg daily. 3. Importance of regular aerobic exercise and losing weight. 4. Reduce dietary saturated fat and cholesterol intake for overall cardiovascular health. 5. Maintaining adequate dietary potassium, calcium, and magnesium intake. 6. Regular monitoring of the blood pressure. 7. Reduce sodium intake to less than 100 mmol/day (less than 2.3 gm of sodium or less than 6 gm of sodium choride)   This patient was seen by [place] with Dr Lavera Guise as a part of collaborative care agreement   Total time spent: 25 Minutes   Time spent includes review of chart, medications, test results, and follow up plan with the patient.      Dr Lavera Guise Internal medicine

## 2019-12-24 ENCOUNTER — Ambulatory Visit (INDEPENDENT_AMBULATORY_CARE_PROVIDER_SITE_OTHER): Payer: Managed Care, Other (non HMO) | Admitting: Nurse Practitioner

## 2019-12-24 ENCOUNTER — Other Ambulatory Visit: Payer: Self-pay

## 2019-12-24 ENCOUNTER — Encounter: Payer: Self-pay | Admitting: Nurse Practitioner

## 2019-12-24 VITALS — BP 131/88 | HR 97 | Temp 97.9°F | Resp 16 | Ht 63.0 in | Wt 153.8 lb

## 2019-12-24 DIAGNOSIS — R300 Dysuria: Secondary | ICD-10-CM

## 2019-12-24 DIAGNOSIS — I10 Essential (primary) hypertension: Secondary | ICD-10-CM

## 2019-12-24 DIAGNOSIS — Z0001 Encounter for general adult medical examination with abnormal findings: Secondary | ICD-10-CM | POA: Diagnosis not present

## 2019-12-24 DIAGNOSIS — R590 Localized enlarged lymph nodes: Secondary | ICD-10-CM | POA: Diagnosis not present

## 2019-12-24 DIAGNOSIS — Z1211 Encounter for screening for malignant neoplasm of colon: Secondary | ICD-10-CM

## 2019-12-24 NOTE — Progress Notes (Signed)
[facility] [place], [place] [address]  Internal MEDICINE  Office Visit Note  Patient Name: Krystal Evans  [id number]  [id number]  Date of Service: 01/13/2020   Pt is here for routine health maintenance examination   Chief Complaint  Patient presents with  . Annual Exam  . Gastroesophageal Reflux  . Hypertension  . Quality Metric Gaps    colonoscopy  . controlled substance form    reviewed with PT     The patient is here for health maintenance exam. Blood pressure is managed well. She has no complaint of chest pain, chest pressure, headache, or shortness of breath. She will be due for routine labs prior to her next visit. She sees GYN provider for well woman care including her pap smear and mammogram. She should have colon cancer screening. She states that she received a cologuard test a "long time aga" and never took the test and mailed it in for evaluation. A new test is recommended.   Current Medication: Outpatient Encounter Medications as of 12/24/2019  Medication Sig  . amLODipine (NORVASC) 10 MG tablet Take 1 tablet (10 mg total) by mouth daily. (Patient taking differently: Take 10 mg by mouth at bedtime. )  . meloxicam (MOBIC) 7.5 MG tablet Take 1 tablet (7.5 mg total) by mouth daily. (Patient taking differently: Take 7.5 mg by mouth daily as needed (pain.). )  . metoprolol tartrate (LOPRESSOR) 25 MG tablet Take 0.5 tablets (12.5 mg total) by mouth 2 (two) times daily as needed. (Patient taking differently: Take 12.5 mg by mouth 2 (two) times daily as needed (hypertension.). )  . olmesartan (BENICAR) 5 MG tablet TAKE 1 TABLET BY MOUTH  DAILY (Patient taking differently: Take 5 mg by mouth at bedtime. )  . ondansetron (ZOFRAN) 4 MG tablet Take 1 tablet (4 mg total) by mouth every 8 (eight) hours as needed for up to 10 doses for nausea or vomiting.  Marland Kitchen oxyCODONE-acetaminophen (PERCOCET) 5-325 MG tablet Take 1 tablet by mouth every 4 (four) hours as  needed for severe pain.   No facility-administered encounter medications on file as of 12/24/2019.    Surgical History: Past Surgical History:  Procedure Laterality Date  . CHOLECYSTECTOMY    . CONTINUOUS NERVE MONITORING N/A 08/21/2019   Procedure: FACIAL NERVE MONITORING;  Surgeon: Carloyn Manner, MD;  Location: [facility] ORS;  Service: ENT;  Laterality: N/A;  . PAROTIDECTOMY Left 08/21/2019   Procedure: PAROTIDECTOMY;  Surgeon: Carloyn Manner, MD;  Location: [facility] ORS;  Service: ENT;  Laterality: Left;  . TUBAL LIGATION      Medical History: Past Medical History:  Diagnosis Date  . Abnormal uterine bleeding   . Bursitis   . Congestive heart failure (CHF) ([place])   . Family history of breast cancer 04/19/2012   IBIS 22.29%  . GERD (gastroesophageal reflux disease)   . Hypertension   . Increased risk of breast cancer 04/19/2012   IBIS 22.29  . Vitamin D deficiency     Family History: Family History  Problem Relation Age of Onset  . Diabetes Mother   . Hypertension Mother   . Breast cancer Mother        53  . Osteoporosis Mother   . Glaucoma Mother   . Osteoporosis Father   . Heart disease Father   . Hypertension Father   . Hypertension Brother   . Diabetes Maternal Aunt   . Breast cancer Paternal Aunt 29       again at  57  . Diabetes Paternal Aunt       Review of Systems  Constitutional: Negative for activity change, chills, fatigue and unexpected weight change.  HENT: Negative for congestion, postnasal drip, rhinorrhea, sneezing and sore throat.   Respiratory: Negative for cough, chest tightness and shortness of breath.   Cardiovascular: Negative for chest pain and palpitations.  Gastrointestinal: Negative for abdominal pain, constipation, diarrhea, nausea and vomiting.  Endocrine: Negative for cold intolerance, heat intolerance, polydipsia and polyuria.  Genitourinary: Negative for dysuria, frequency and urgency.  Musculoskeletal: Negative for arthralgias,  back pain, joint swelling and neck pain.  Skin: Negative for rash.  Allergic/Immunologic: Negative for environmental allergies.  Neurological: Negative for dizziness, tremors, numbness and headaches.  Hematological: Negative for adenopathy. Does not bruise/bleed easily.  Psychiatric/Behavioral: Negative for behavioral problems (Depression), sleep disturbance and suicidal ideas. The patient is not nervous/anxious.      Today's Vitals   12/24/19 0956  BP: 131/88  Pulse: 97  Resp: 16  Temp: 97.9 F (36.6 C)  SpO2: 98%  Weight: 153 lb 12.8 oz (69.8 kg)  Height: 5\' 3"  (1.6 m)   Body mass index is 27.24 kg/m.  Physical Exam Vitals and nursing note reviewed.  Constitutional:      General: She is not in acute distress.    Appearance: Normal appearance. She is well-developed. She is not diaphoretic.  HENT:     Head: Normocephalic and atraumatic.     Nose: Nose normal.     Mouth/Throat:     Pharynx: No oropharyngeal exudate.  Eyes:     Pupils: Pupils are equal, round, and reactive to light.  Neck:     Thyroid: No thyromegaly.     Vascular: No JVD.     Trachea: No tracheal deviation.     Comments: Small cervical lymph nodes palpable inferior to recent surgical scar.  Cardiovascular:     Rate and Rhythm: Normal rate and regular rhythm.     Pulses: Normal pulses.     Heart sounds: Normal heart sounds. No murmur heard.  No friction rub. No gallop.   Pulmonary:     Effort: Pulmonary effort is normal. No respiratory distress.     Breath sounds: Normal breath sounds. No wheezing or rales.  Chest:     Chest wall: No tenderness.  Abdominal:     General: Bowel sounds are normal.     Palpations: Abdomen is soft.     Tenderness: There is no abdominal tenderness.  Musculoskeletal:        General: Normal range of motion.     Cervical back: Normal range of motion and neck supple.  Lymphadenopathy:     Cervical: Cervical adenopathy present.  Skin:    General: Skin is warm and dry.      Capillary Refill: Capillary refill takes less than 2 seconds.  Neurological:     General: No focal deficit present.     Mental Status: She is alert and oriented to person, place, and time.     Cranial Nerves: No cranial nerve deficit.  Psychiatric:        Mood and Affect: Mood normal.        Behavior: Behavior normal.        Thought Content: Thought content normal.        Judgment: Judgment normal.      LABS: Recent Results (from the past 2160 hour(s))  UA/M w/rflx Culture, Routine     Status: None   Collection Time: 12/24/19 10:25  AM   Specimen: Urine   Urine  Result Value Ref Range   Specific Gravity, UA CANCELED     Comment: Test not performed. Insufficient specimen to perform or complete analysis.  Result canceled by the ancillary.    pH, UA CANCELED     Comment: Test not performed  Result canceled by the ancillary.    Protein,UA CANCELED     Comment: Test not performed  Result canceled by the ancillary.    Glucose, UA CANCELED     Comment: Test not performed  Result canceled by the ancillary.    Ketones, UA CANCELED     Comment: Test not performed  Result canceled by the ancillary.   Microscopic Examination     Status: None (Preliminary result)   Collection Time: 12/24/19 10:25 AM   Urine  Result Value Ref Range   WBC, UA WILL FOLLOW    RBC WILL FOLLOW    Epithelial Cells (non renal) WILL FOLLOW    Renal Epithel, UA WILL FOLLOW    Casts WILL FOLLOW    Cast Type WILL FOLLOW    Crystals WILL FOLLOW    Crystal Type WILL FOLLOW    Mucus, UA WILL FOLLOW    Bacteria, UA WILL FOLLOW    Yeast, UA WILL FOLLOW    Trichomonas, UA WILL FOLLOW    Urinalysis Comments WILL FOLLOW     Assessment/Plan: 1. Encounter for general adult medical examination with abnormal findings Annual health maintenance exam.   2. Essential hypertension Stable. Continue bp medication as prescribed   3. Enlarged lymph node in neck Small palpable noed beneath surgical  scar from prior lymph node removal. Will be following up with ENT surgeon for further evaluation.   4. Dysuria - UA/M w/rflx Culture, Routine  5. Screening for colon Cancer Order for cologuard to be sent to [facility]. Will review results when available.    General Counseling: ramey ketcherside understanding of the findings of todays visit and agrees with plan of treatment. I have discussed any further diagnostic evaluation that may be needed or ordered today. We also reviewed her medications today. she has been encouraged to call the office with any questions or concerns that should arise related to todays visit.    Counseling:  This patient was seen by Leretha Pol FNP Collaboration with Dr Lavera Guise as a part of collaborative care agreement  Orders Placed This Encounter  Procedures  . Microscopic Examination  . UA/M w/rflx Culture, Routine     Total time spent: 30 Minutes  Time spent includes review of chart, medications, test results, and follow up plan with the patient.     Lavera Guise, MD  Internal Medicine

## 2019-12-27 LAB — MICROSCOPIC EXAMINATION

## 2019-12-27 LAB — UA/M W/RFLX CULTURE, ROUTINE
Glucose, UA: NEGATIVE
Urobilinogen, Ur: 0.2 mg/dL (ref 0.2–1.0)

## 2019-12-29 LAB — UA/M W/RFLX CULTURE, ROUTINE

## 2020-01-13 DIAGNOSIS — Z1211 Encounter for screening for malignant neoplasm of colon: Secondary | ICD-10-CM | POA: Insufficient documentation

## 2020-01-13 DIAGNOSIS — Z0001 Encounter for general adult medical examination with abnormal findings: Secondary | ICD-10-CM | POA: Insufficient documentation

## 2020-01-13 DIAGNOSIS — R300 Dysuria: Secondary | ICD-10-CM | POA: Insufficient documentation

## 2020-01-24 ENCOUNTER — Telehealth: Payer: Self-pay

## 2020-01-24 NOTE — Telephone Encounter (Signed)
Faxed Cologuard paperwork to [facility].

## 2020-01-28 ENCOUNTER — Other Ambulatory Visit: Payer: Self-pay | Admitting: Internal Medicine

## 2020-01-28 DIAGNOSIS — I10 Essential (primary) hypertension: Secondary | ICD-10-CM

## 2020-02-20 ENCOUNTER — Other Ambulatory Visit: Payer: Self-pay

## 2020-02-20 ENCOUNTER — Ambulatory Visit (INDEPENDENT_AMBULATORY_CARE_PROVIDER_SITE_OTHER): Payer: Managed Care, Other (non HMO)

## 2020-02-20 ENCOUNTER — Encounter: Payer: Self-pay | Admitting: Podiatry

## 2020-02-20 ENCOUNTER — Ambulatory Visit (INDEPENDENT_AMBULATORY_CARE_PROVIDER_SITE_OTHER): Payer: Managed Care, Other (non HMO) | Admitting: Podiatry

## 2020-02-20 DIAGNOSIS — M722 Plantar fascial fibromatosis: Secondary | ICD-10-CM | POA: Diagnosis not present

## 2020-02-20 MED ORDER — METHYLPREDNISOLONE 4 MG PO TBPK: ORAL_TABLET | ORAL | 0 refills | Status: DC

## 2020-02-20 MED ORDER — MELOXICAM 15 MG PO TABS: 15.0000 mg | ORAL_TABLET | Freq: Every day | ORAL | 3 refills | Status: DC

## 2020-02-20 MED ORDER — TRIAMCINOLONE ACETONIDE 40 MG/ML IJ SUSP
40.0000 mg | Freq: Once | INTRAMUSCULAR | Status: AC
  Administered 2020-02-20: 40 mg

## 2020-02-20 NOTE — Patient Instructions (Signed)

## 2020-02-20 NOTE — Progress Notes (Signed)
Subjective:  Patient ID: Krystal Evans, female    DOB: [birth year],  MRN: [id number] HPI Chief Complaint  Patient presents with  . Foot Pain    Plantar heel bilateral (R>L) - aching x 1 year, started to worsen recently, AM pain, just tried resting them more  . Toe Pain    Plantar 2nd toe left - tiny nodule x several months, tender with pressure  . New Patient (Initial Visit)    55 y.o. female presents with the above complaint.   ROS: Denies fever chills nausea vomiting muscle aches pains calf pain back pain chest pain shortness of breath.  Past Medical History:  Diagnosis Date  . Abnormal uterine bleeding   . Bursitis   . Congestive heart failure (CHF) ([place])   . Family history of breast cancer 04/19/2012   IBIS 22.29%  . GERD (gastroesophageal reflux disease)   . Hypertension   . Increased risk of breast cancer 04/19/2012   IBIS 22.29  . Vitamin D deficiency    Past Surgical History:  Procedure Laterality Date  . CHOLECYSTECTOMY    . CONTINUOUS NERVE MONITORING N/A 08/21/2019   Procedure: FACIAL NERVE MONITORING;  Surgeon: Carloyn Manner, MD;  Location: [facility] ORS;  Service: ENT;  Laterality: N/A;  . PAROTIDECTOMY Left 08/21/2019   Procedure: PAROTIDECTOMY;  Surgeon: Carloyn Manner, MD;  Location: [facility] ORS;  Service: ENT;  Laterality: Left;  . TUBAL LIGATION      Current Outpatient Medications:  .  meloxicam (MOBIC) 15 MG tablet, Take 1 tablet (15 mg total) by mouth daily., Disp: 30 tablet, Rfl: 3 .  methylPREDNISolone (MEDROL DOSEPAK) 4 MG TBPK tablet, 6 day dose pack - take as directed, Disp: 21 tablet, Rfl: 0 .  amLODipine (NORVASC) 10 MG tablet, Take 1 tablet (10 mg total) by mouth daily. (Patient taking differently: Take 10 mg by mouth at bedtime. ), Disp: 90 tablet, Rfl: 3 .  metoprolol tartrate (LOPRESSOR) 25 MG tablet, Take 0.5 tablets (12.5 mg total) by mouth 2 (two) times daily as needed. (Patient taking differently: Take 12.5 mg by mouth 2 (two) times daily as  needed (hypertension.). ), Disp: 30 tablet, Rfl: 2 .  olmesartan (BENICAR) 5 MG tablet, Take 1 tablet (5 mg total) by mouth at bedtime., Disp: 90 tablet, Rfl: 0  No Known Allergies Review of Systems Objective:  There were no vitals filed for this visit.  General: Well developed, nourished, in no acute distress, alert and oriented x3   Dermatological: Skin is warm, dry and supple bilateral. Nails x 10 are well maintained; remaining integument appears unremarkable at this time. There are no open sores, no preulcerative lesions, no rash or signs of infection present.  Vascular: Dorsalis Pedis artery and Posterior Tibial artery pedal pulses are 2/4 bilateral with immedate capillary fill time. Pedal hair growth present. No varicosities and no lower extremity edema present bilateral.   Neruologic: Grossly intact via light touch bilateral. Vibratory intact via tuning fork bilateral. Protective threshold with Semmes Wienstein monofilament intact to all pedal sites bilateral. Patellar and Achilles deep tendon reflexes 2+ bilateral. No Babinski or clonus noted bilateral.   Musculoskeletal: No gross boney pedal deformities bilateral. No pain, crepitus, or limitation noted with foot and ankle range of motion bilateral. Muscular strength 5/5 in all groups tested bilateral.  Gait: Unassisted, Nonantalgic.    Radiographs:  Radiographs taken today demonstrate an osseous immature individual soft tissue increase in density with contrast containing insertion site mild pes planus no significant osteoarthritic changes.  Pain with palpation medial calcaneal tubercles right greater than left.  She also has a small cyst just at the level of the plantar surface of the PIPJ second digit left foot.  Assessment & Plan:   Assessment: Planter fasciitis bilateral.  Small cyst plantar aspect second toe PIPJ plantarly.  Plan: Discussed etiology pathology conservative versus surgical therapies.  Injected the bilateral  heels 20 mg Kenalog 5 mg Marcaine started her on a Medrol Dosepak to be followed by meloxicam 15 mg 1 p.o. daily also plantar fascial braces bilateral single night splint.  Discussed appropriate shoe gear stretching exercises ice therapy and shoe gear modifications.  Follow-up with her in 1 month     Sallyann Kinnaird T. [place], [place]

## 2020-02-24 LAB — COLOGUARD: COLOGUARD: NEGATIVE

## 2020-02-24 LAB — EXTERNAL GENERIC LAB PROCEDURE: COLOGUARD: NEGATIVE

## 2020-02-26 ENCOUNTER — Telehealth: Payer: Self-pay

## 2020-02-26 NOTE — Telephone Encounter (Signed)
Spoke with pt, informed her cologuard results are negative  

## 2020-03-24 ENCOUNTER — Other Ambulatory Visit: Payer: Self-pay

## 2020-03-24 ENCOUNTER — Ambulatory Visit: Payer: Managed Care, Other (non HMO) | Admitting: Podiatry

## 2020-03-24 ENCOUNTER — Encounter: Payer: Self-pay | Admitting: Podiatry

## 2020-03-24 DIAGNOSIS — M722 Plantar fascial fibromatosis: Secondary | ICD-10-CM

## 2020-03-24 MED ORDER — CELECOXIB 100 MG PO CAPS: 200.0000 mg | ORAL_CAPSULE | Freq: Two times a day (BID) | ORAL | 3 refills | Status: DC

## 2020-03-24 NOTE — Progress Notes (Signed)
She presents today for follow-up of her bilateral plantar fasciitis states that I have got new feet there 99% better.  She states that the meloxicam is giving her headache.  Objective: Vital signs are stable alert oriented x3.  No reproducible pain on palpation medial cuticle left just a small amount on the right heel.  Assessment: Resolving plantar fasciitis nearly 100% improved.  Plan: At this point I am going to discontinue the meloxicam and start her on Celebrex 200 mg 1 p.o. daily I will follow-up with her in a couple months if necessary.

## 2020-04-14 ENCOUNTER — Encounter: Payer: Self-pay | Admitting: Physician Assistant

## 2020-04-14 ENCOUNTER — Ambulatory Visit: Payer: Managed Care, Other (non HMO) | Admitting: Physician Assistant

## 2020-04-14 DIAGNOSIS — M722 Plantar fascial fibromatosis: Secondary | ICD-10-CM | POA: Diagnosis not present

## 2020-04-14 DIAGNOSIS — M542 Cervicalgia: Secondary | ICD-10-CM | POA: Diagnosis not present

## 2020-04-14 DIAGNOSIS — R002 Palpitations: Secondary | ICD-10-CM | POA: Diagnosis not present

## 2020-04-14 DIAGNOSIS — I10 Essential (primary) hypertension: Secondary | ICD-10-CM | POA: Diagnosis not present

## 2020-04-14 DIAGNOSIS — G8929 Other chronic pain: Secondary | ICD-10-CM

## 2020-04-14 MED ORDER — OLMESARTAN MEDOXOMIL 5 MG PO TABS: 5.0000 mg | ORAL_TABLET | Freq: Every day | ORAL | 3 refills | Status: DC

## 2020-04-14 MED ORDER — AMLODIPINE BESYLATE 10 MG PO TABS: 10.0000 mg | ORAL_TABLET | Freq: Every day | ORAL | 3 refills | Status: DC

## 2020-04-14 NOTE — Progress Notes (Signed)
[facility] [place], [place] [address]  Internal MEDICINE  Office Visit Note  Patient Name: Krystal Evans  [id number]  [id number]  Date of Service: 04/16/2020  Chief Complaint  Patient presents with  . Hypertension    6 month f-up    HPI Pt is here for routine f/u -She has Plantar fasciitis in both feet and went to ortho and was given injections. She was also started on mobic but that gave her headaches, so she was switched to celebrex. She has been on celebrex a few weeks now. Still some low grade headaches and mentions she is seeing neurology on the 24th for neck pain. Advised to discuss headaches with neurology as well since they may be related to neck pain or possible occipital neuralgia. -BP not checked at home. On metoprolol bc of fluterring and did EKG over a year ago which showed frequent PVCs. This got better and she never saw cardiology. Was then changed to only taking metoprolol as needed if flutering occurs. Denies flutering currently or recently. -Was given lab slip in Nov but did not get it done. She will go this week.  Current Medication: Outpatient Encounter Medications as of 04/14/2020  Medication Sig  . celecoxib (CELEBREX) 100 MG capsule Take 2 capsules (200 mg total) by mouth 2 (two) times daily.  . metoprolol tartrate (LOPRESSOR) 25 MG tablet Take 0.5 tablets (12.5 mg total) by mouth 2 (two) times daily as needed. (Patient taking differently: Take 12.5 mg by mouth 2 (two) times daily as needed (hypertension.).)  . [DISCONTINUED] amLODipine (NORVASC) 10 MG tablet Take 1 tablet (10 mg total) by mouth daily. (Patient taking differently: Take 10 mg by mouth at bedtime.)  . [DISCONTINUED] olmesartan (BENICAR) 5 MG tablet Take 1 tablet (5 mg total) by mouth at bedtime.  Marland Kitchen amLODipine (NORVASC) 10 MG tablet Take 1 tablet (10 mg total) by mouth daily.  Marland Kitchen olmesartan (BENICAR) 5 MG tablet Take 1 tablet (5 mg total) by mouth at bedtime.  . [DISCONTINUED]  meloxicam (MOBIC) 15 MG tablet Take 1 tablet (15 mg total) by mouth daily. (Patient not taking: Reported on 04/14/2020)   No facility-administered encounter medications on file as of 04/14/2020.    Surgical History: Past Surgical History:  Procedure Laterality Date  . CHOLECYSTECTOMY    . CONTINUOUS NERVE MONITORING N/A 08/21/2019   Procedure: FACIAL NERVE MONITORING;  Surgeon: Carloyn Manner, MD;  Location: [facility] ORS;  Service: ENT;  Laterality: N/A;  . PAROTIDECTOMY Left 08/21/2019   Procedure: PAROTIDECTOMY;  Surgeon: Carloyn Manner, MD;  Location: [facility] ORS;  Service: ENT;  Laterality: Left;  . TUBAL LIGATION      Medical History: Past Medical History:  Diagnosis Date  . Abnormal uterine bleeding   . Bursitis   . Congestive heart failure (CHF) ([place])   . Family history of breast cancer 04/19/2012   IBIS 22.29%  . GERD (gastroesophageal reflux disease)   . Hypertension   . Increased risk of breast cancer 04/19/2012   IBIS 22.29  . Vitamin D deficiency     Family History: Family History  Problem Relation Age of Onset  . Diabetes Mother   . Hypertension Mother   . Breast cancer Mother        21  . Osteoporosis Mother   . Glaucoma Mother   . Osteoporosis Father   . Heart disease Father   . Hypertension Father   . Hypertension Brother   . Diabetes Maternal Aunt   .  Breast cancer Paternal Aunt 63       again at 58  . Diabetes Paternal Aunt     Social History   Socioeconomic History  . Marital status: Married    Spouse name: Not on file  . Number of children: 2  . Years of education: Not on file  . Highest education level: Not on file  Occupational History  . Occupation: A/R rep    Employer: [employer]  Tobacco Use  . Smoking status: Never Smoker  . Smokeless tobacco: Never Used  Vaping Use  . Vaping Use: Never used  Substance and Sexual Activity  . Alcohol use: No  . Drug use: No  . Sexual activity: Yes    Birth control/protection: Surgical  Other Topics  Concern  . Not on file  Social History Narrative  . Not on file   Social Determinants of Health   Financial Resource Strain: Not on file  Food Insecurity: Not on file  Transportation Needs: Not on file  Physical Activity: Not on file  Stress: Not on file  Social Connections: Not on file  Intimate Partner Violence: Not on file      Review of Systems  Constitutional: Negative for chills, fatigue and unexpected weight change.  HENT: Negative for congestion, postnasal drip, rhinorrhea, sneezing and sore throat.   Eyes: Negative for redness and visual disturbance.  Respiratory: Negative for cough, chest tightness and shortness of breath.   Cardiovascular: Negative for chest pain and palpitations.  Gastrointestinal: Negative for abdominal pain, constipation, diarrhea, nausea and vomiting.  Genitourinary: Negative for dysuria and frequency.  Musculoskeletal: Positive for neck pain. Negative for arthralgias, back pain and joint swelling.       Plantar fascitis bilateral  Skin: Negative for rash.  Neurological: Positive for headaches. Negative for tremors and numbness.  Hematological: Negative for adenopathy. Does not bruise/bleed easily.  Psychiatric/Behavioral: Negative for behavioral problems (Depression), sleep disturbance and suicidal ideas. The patient is not nervous/anxious.     Vital Signs: BP 126/90   Pulse 80   Temp 98.4 F (36.9 C)   Resp 16   Ht 5\' 3"  (1.6 m)   Wt 161 lb 6.4 oz (73.2 kg)   SpO2 98%   BMI 28.59 kg/m    Physical Exam Constitutional:      General: She is not in acute distress.    Appearance: She is well-developed. She is not diaphoretic.  HENT:     Head: Normocephalic and atraumatic.     Mouth/Throat:     Pharynx: No oropharyngeal exudate.  Eyes:     Pupils: Pupils are equal, round, and reactive to light.  Neck:     Thyroid: No thyromegaly.     Vascular: No JVD.     Trachea: No tracheal deviation.  Cardiovascular:     Rate and Rhythm:  Normal rate and regular rhythm.     Heart sounds: Normal heart sounds. No murmur heard. No friction rub. No gallop.   Pulmonary:     Effort: Pulmonary effort is normal. No respiratory distress.     Breath sounds: No wheezing or rales.  Chest:     Chest wall: No tenderness.  Abdominal:     General: Bowel sounds are normal.     Palpations: Abdomen is soft.  Musculoskeletal:        General: Normal range of motion.     Cervical back: Normal range of motion and neck supple.  Lymphadenopathy:     Cervical: No cervical adenopathy.  Skin:    General: Skin is warm and dry.  Neurological:     Mental Status: She is alert and oriented to person, place, and time.     Cranial Nerves: No cranial nerve deficit.  Psychiatric:        Behavior: Behavior normal.        Thought Content: Thought content normal.        Judgment: Judgment normal.        Assessment/Plan: 1. Essential hypertension Stable, continue current medications and continue to monitor. Requested refills today. - olmesartan (BENICAR) 5 MG tablet; Take 1 tablet (5 mg total) by mouth at bedtime.  Dispense: 90 tablet; Refill: 3 - amLODipine (NORVASC) 10 MG tablet; Take 1 tablet (10 mg total) by mouth daily.  Dispense: 90 tablet; Refill: 3  2. Palpitations Has not been a problem in a long time. Takes metoprolol as needed if any recurrences.  3. Plantar fasciitis, bilateral Followed by ortho. Received injections and was switched from mobic to celebrex due to headaches.  4. Neck pain, chronic Has upcoming appt with neurology, will also address headache concerns at that visit given they may be related. headaches are in the back of her head and radiate down neck.   General Counseling: sary bogie understanding of the findings of todays visit and agrees with plan of treatment. I have discussed any further diagnostic evaluation that may be needed or ordered today. We also reviewed her medications today. she has been encouraged  to call the office with any questions or concerns that should arise related to todays visit.    No orders of the defined types were placed in this encounter.   Meds ordered this encounter  Medications  . olmesartan (BENICAR) 5 MG tablet    Sig: Take 1 tablet (5 mg total) by mouth at bedtime.    Dispense:  90 tablet    Refill:  3  . amLODipine (NORVASC) 10 MG tablet    Sig: Take 1 tablet (10 mg total) by mouth daily.    Dispense:  90 tablet    Refill:  3    This patient was seen by Drema Dallas, PA-C in collaboration with Dr. Clayborn Bigness as a part of collaborative care agreement.   Total time spent:30 Minutes Time spent includes review of chart, medications, test results, and follow up plan with the patient.      Dr Lavera Guise Internal medicine

## 2020-05-05 ENCOUNTER — Encounter: Payer: Managed Care, Other (non HMO) | Admitting: Podiatry

## 2020-05-12 LAB — COLOGUARD

## 2020-06-23 ENCOUNTER — Ambulatory Visit: Payer: Managed Care, Other (non HMO) | Admitting: Hospice and Palliative Medicine

## 2020-08-12 ENCOUNTER — Other Ambulatory Visit: Payer: Self-pay | Admitting: Physician Assistant

## 2020-08-12 DIAGNOSIS — Z1231 Encounter for screening mammogram for malignant neoplasm of breast: Secondary | ICD-10-CM

## 2020-08-20 ENCOUNTER — Ambulatory Visit
Admission: RE | Admit: 2020-08-20 | Discharge: 2020-08-20 | Disposition: A | Discharge status: Home / Self Care | Payer: 59 | Source: Ambulatory Visit | Attending: Physician Assistant | Admitting: Physician Assistant

## 2020-08-20 ENCOUNTER — Other Ambulatory Visit: Payer: Self-pay

## 2020-08-20 DIAGNOSIS — Z1231 Encounter for screening mammogram for malignant neoplasm of breast: Secondary | ICD-10-CM

## 2020-09-16 ENCOUNTER — Ambulatory Visit (INDEPENDENT_AMBULATORY_CARE_PROVIDER_SITE_OTHER): Payer: Managed Care, Other (non HMO) | Admitting: Obstetrics

## 2020-09-16 ENCOUNTER — Other Ambulatory Visit: Payer: Self-pay

## 2020-09-16 ENCOUNTER — Encounter: Payer: Self-pay | Admitting: Obstetrics

## 2020-09-16 VITALS — BP 114/60 | Ht 63.0 in | Wt 161.0 lb

## 2020-09-16 DIAGNOSIS — Z124 Encounter for screening for malignant neoplasm of cervix: Secondary | ICD-10-CM | POA: Diagnosis not present

## 2020-09-16 DIAGNOSIS — Z01419 Encounter for gynecological examination (general) (routine) without abnormal findings: Secondary | ICD-10-CM | POA: Diagnosis not present

## 2020-09-16 NOTE — Progress Notes (Signed)
Gynecology Annual Exam  PCP: Lavera Guise, MD  Chief Complaint:  Chief Complaint  Patient presents with   Gynecologic Exam    No concerns    History of Present Illness:Patient is a 55 y.o. G2P2002 presents for annual exam. The patient has no complaints today. She has irregular periods and other sxs of perimenopause  LMP: No LMP recorded. Patient is perimenopausal. Menarche:12 Average Interval: irregular, not applicable days Duration of flow: 4 days Heavy Menses: no Clots: no Intermenstrual Bleeding: no Postcoital Bleeding: no Dysmenorrhea: no  The patient is sexually active. She denies dyspareunia.  The patient does perform self breast exams.  There is notable family history of breast or ovarian cancer in her family. Her mother had breast Cancer in her 11s . Kiley has been heistent to get Myriad testing.  The patient wears seatbelts: yes.   The patient has regular exercise: no.    The patient denies current symptoms of depression.     Review of Systems: Review of Systems  Constitutional: Negative.   HENT: Negative.    Eyes: Negative.   Respiratory: Negative.    Cardiovascular:  Positive for palpitations.       CHTN on meds  Gastrointestinal: Negative.   Genitourinary: Negative.   Musculoskeletal: Negative.   Skin: Negative.   Neurological:  Positive for tingling.       Her hands tingle- likely Carpal tunnel  Endo/Heme/Allergies: Negative.   Psychiatric/Behavioral: Negative.     Past Medical History:  Patient Active Problem List   Diagnosis Date Noted   Encounter for general adult medical examination with abnormal findings 01/13/2020   Dysuria 01/13/2020   Screening for colon cancer 01/13/2020   Enlarged lymph node in neck 05/02/2019   Chronic shoulder bursitis, right 04/24/2018   Positive ANA (antinuclear antibody) 04/24/2018    Formatting of this note might be different from the original. POSITIVE direct ANA.  Elevation RNP.   Normal double-stranded  DNA, normal anti-Smith, normal SSA and SSB, normal SCL 70, AND ANTI-JO 1.     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   Other fatigue 04/12/2018   Bursitis of right hip 08/28/2017   Myalgia 02/04/2017   Palpitations 02/04/2017   Gastro-esophageal reflux disease without esophagitis 02/04/2017   Essential hypertension 07/07/2016   Perimenopausal symptoms 07/07/2016   Family history of breast cancer 07/07/2016   Vitamin D deficiency 07/07/2016    Past Surgical History:  Past Surgical History:  Procedure Laterality Date   CHOLECYSTECTOMY     CONTINUOUS NERVE MONITORING N/A 08/21/2019   Procedure: FACIAL NERVE MONITORING;  Surgeon: Carloyn Manner, MD;  Location: [facility] ORS;  Service: ENT;  Laterality: N/A;   PAROTIDECTOMY Left 08/21/2019   Procedure: Lindell Spar;  Surgeon: Carloyn Manner, MD;  Location: [facility] ORS;  Service: ENT;  Laterality: Left;   TUBAL LIGATION      Gynecologic History:  No LMP recorded. Patient is perimenopausal. Last Pap: Results were: 2019 no abnormalities  Last mammogram: 2022 Results were: BI-RAD I  Obstetric History: G2P2002  Family History:  Family History  Problem Relation Age of Onset   Diabetes Mother    Hypertension Mother    Breast cancer Mother        2   Osteoporosis Mother    Glaucoma Mother    Osteoporosis Father    Heart disease Father    Hypertension Father    Hypertension Brother    Diabetes Maternal Aunt    Breast cancer Paternal  Aunt 31       again at 90   Diabetes Paternal Aunt     Social History:  Social History   Socioeconomic History   Marital status: Married    Spouse name: Not on file   Number of children: 2   Years of education: Not on file   Highest education level: Not on file  Occupational History   Occupation: A/R rep    Employer: [employer]  Tobacco Use   Smoking status: Never   Smokeless tobacco: Never  Vaping Use   Vaping Use: Never used  Substance and Sexual  Activity   Alcohol use: No   Drug use: No   Sexual activity: Yes    Birth control/protection: Surgical  Other Topics Concern   Not on file  Social History Narrative   Not on file   Social Determinants of Health   Financial Resource Strain: Not on file  Food Insecurity: Not on file  Transportation Needs: Not on file  Physical Activity: Not on file  Stress: Not on file  Social Connections: Not on file  Intimate Partner Violence: Not on file    Allergies:  No Known Allergies  Medications: Prior to Admission medications   Medication Sig Start Date End Date Taking? Authorizing Provider  amLODipine (NORVASC) 10 MG tablet Take 1 tablet (10 mg total) by mouth daily. 04/14/20  Yes McDonough, Lauren K, PA-C  meloxicam (MOBIC) 15 MG tablet Take 15 mg by mouth daily. 08/06/20  Yes [provider]  metoprolol tartrate (LOPRESSOR) 25 MG tablet Take 0.5 tablets (12.5 mg total) by mouth 2 (two) times daily as needed. Patient taking differently: Take 12.5 mg by mouth 2 (two) times daily as needed (hypertension.). 04/24/19  Yes Boscia, Heather E, NP  olmesartan (BENICAR) 5 MG tablet Take 1 tablet (5 mg total) by mouth at bedtime. 04/14/20  Yes McDonough, Si Gaul, PA-C    Physical Exam Vitals: Blood pressure 114/60, height '5\' 3"'  (1.6 m), weight 161 lb (73 kg).  General: NAD HEENT: normocephalic, anicteric Thyroid: no enlargement, no palpable nodules Pulmonary: No increased work of breathing, CTAB Cardiovascular: RRR, distal pulses 2+ Breast: Breast symmetrical, no tenderness, no palpable nodules or masses, no skin or nipple retraction present, no nipple discharge.  No axillary or supraclavicular lymphadenopathy. Abdomen: NABS, soft, non-tender, non-distended.  Umbilicus without lesions.  No hepatomegaly, splenomegaly or masses palpable. No evidence of hernia  Genitourinary:  External: Normal external female genitalia.  Normal urethral meatus, normal Bartholin's and Skene's glands.     Vagina: Normal vaginal mucosa, no evidence of prolapse.    Cervix: Grossly normal in appearance, no bleeding  Uterus: Non-enlarged, mobile, normal contour.  No CMT  Adnexa: ovaries non-enlarged, no adnexal masses  Rectal: deferred  Lymphatic: no evidence of inguinal lymphadenopathy Extremities: no edema, erythema, or tenderness Neurologic: Grossly intact Psychiatric: mood appropriate, affect full  Female chaperone present for pelvic and breast  portions of the physical exam     Assessment: 55 y.o. G2P2002 routine annual exam  Plan: Problem List Items Addressed This Visit   None   1) Mammogram - recommend yearly screening mammogram.  Mammogram Is up to date  2) STI screening  wasoffered and declined  3) ASCCP guidelines and rational discussed.  Patient opts for every 3 years screening interval  4) Osteoporosis  - per USPTF routine screening DEXA at age 82   Consider FDA-approved medical therapies in postmenopausal women and men aged 48 years and older, based on the following:  a) A hip or vertebral (clinical or morphometric) fracture b) T-score ? -2.5 at the femoral neck or spine after appropriate evaluation to exclude secondary causes C) Low bone mass (T-score between -1.0 and -2.5 at the femoral neck or spine) and a 10-year probability of a hip fracture ? 3% or a 10-year probability of a major osteoporosis-related fracture ? 20% based on the [place]-adapted WHO algorithm   5) Routine healthcare maintenance including cholesterol, diabetes screening discussed managed by PCP  6) Colonoscopy has had cologard this year..  Screening recommended starting at age 22 for average risk individuals, age 37 for individuals deemed at increased risk (including African Americans) and recommended to continue until age 14.  For patient age 76-85 individualized approach is recommended.  Gold standard screening is via colonoscopy, Cologuard screening is an acceptable alternative for patient unwilling  or unable to undergo colonoscopy.  "Colorectal cancer screening for average?risk adults: 2018 guideline update from the [place]: A Cancer Journal for Clinicians: Jul 07, 2016   7) Return in about 1 year (around 09/16/2021) for annual.  Discussed the Myriad test and geven literature. She is open to talking to [place] about this.  Imagene Riches, CNM  09/16/2020 8:50 AM   Mosetta Pigeon, [facility] 09/16/2020, 8:49 AM

## 2020-09-19 LAB — IGP,CTNG,APTIMAHPV,RFX16/18,45
Chlamydia, Nuc. Acid Amp: NEGATIVE
Gonococcus by Nucleic Acid Amp: NEGATIVE
HPV Aptima: NEGATIVE

## 2020-09-22 ENCOUNTER — Encounter: Payer: Self-pay | Admitting: Obstetrics

## 2020-10-16 ENCOUNTER — Encounter: Payer: Managed Care, Other (non HMO) | Admitting: Physician Assistant

## 2020-10-21 ENCOUNTER — Encounter: Payer: Self-pay | Admitting: Obstetrics and Gynecology

## 2020-12-08 ENCOUNTER — Other Ambulatory Visit: Payer: Self-pay

## 2020-12-08 MED ORDER — METOPROLOL TARTRATE 25 MG PO TABS: 12.5000 mg | ORAL_TABLET | Freq: Two times a day (BID) | ORAL | 2 refills | Status: DC | PRN

## 2020-12-12 ENCOUNTER — Other Ambulatory Visit: Payer: Self-pay | Admitting: Nurse Practitioner

## 2020-12-13 LAB — CBC
Hematocrit: 34.9 % (ref 34.0–46.6)
Hemoglobin: 11.5 g/dL (ref 11.1–15.9)
MCH: 29.6 pg (ref 26.6–33.0)
MCHC: 33 g/dL (ref 31.5–35.7)
MCV: 90 fL (ref 79–97)
Platelets: 286 10*3/uL (ref 150–450)
RBC: 3.88 x10E6/uL (ref 3.77–5.28)
RDW: 13.8 % (ref 11.7–15.4)
WBC: 6.2 10*3/uL (ref 3.4–10.8)

## 2020-12-13 LAB — COMPREHENSIVE METABOLIC PANEL
ALT: 25 IU/L (ref 0–32)
AST: 25 IU/L (ref 0–40)
Albumin/Globulin Ratio: 1.5 (ref 1.2–2.2)
Albumin: 4 g/dL (ref 3.8–4.9)
Alkaline Phosphatase: 87 IU/L (ref 44–121)
BUN/Creatinine Ratio: 10 (ref 9–23)
BUN: 9 mg/dL (ref 6–24)
Bilirubin Total: 0.3 mg/dL (ref 0.0–1.2)
CO2: 23 mmol/L (ref 20–29)
Calcium: 8.9 mg/dL (ref 8.7–10.2)
Chloride: 105 mmol/L (ref 96–106)
Creatinine, Ser: 0.86 mg/dL (ref 0.57–1.00)
Globulin, Total: 2.6 g/dL (ref 1.5–4.5)
Glucose: 83 mg/dL (ref 70–99)
Potassium: 4.3 mmol/L (ref 3.5–5.2)
Sodium: 141 mmol/L (ref 134–144)
Total Protein: 6.6 g/dL (ref 6.0–8.5)
eGFR: 81 mL/min/{1.73_m2} (ref >59)

## 2020-12-13 LAB — LIPID PANEL WITH LDL/HDL RATIO
Cholesterol, Total: 190 mg/dL (ref 100–199)
HDL: 64 mg/dL (ref >39)
LDL Chol Calc (NIH): 107 mg/dL — ABNORMAL HIGH (ref 0–99)
LDL/HDL Ratio: 1.7 ratio (ref 0.0–3.2)
Triglycerides: 104 mg/dL (ref 0–149)
VLDL Cholesterol Cal: 19 mg/dL (ref 5–40)

## 2020-12-13 LAB — TSH: TSH: 2.28 u[IU]/mL (ref 0.450–4.500)

## 2020-12-13 LAB — VITAMIN D 25 HYDROXY (VIT D DEFICIENCY, FRACTURES): Vit D, 25-Hydroxy: 8.5 ng/mL — ABNORMAL LOW (ref 30.0–100.0)

## 2020-12-13 LAB — T4, FREE: Free T4: 0.96 ng/dL (ref 0.82–1.77)

## 2020-12-15 NOTE — Progress Notes (Signed)
Most labs improved. Patient wth severe vitamin d deficiency. Will treat with weekly drisdol. Discuss with patient at visit 12/26/2020

## 2020-12-22 ENCOUNTER — Other Ambulatory Visit: Payer: Self-pay

## 2020-12-22 ENCOUNTER — Encounter: Payer: Self-pay | Admitting: Physician Assistant

## 2020-12-22 ENCOUNTER — Ambulatory Visit (INDEPENDENT_AMBULATORY_CARE_PROVIDER_SITE_OTHER): Payer: Managed Care, Other (non HMO) | Admitting: Physician Assistant

## 2020-12-22 DIAGNOSIS — I10 Essential (primary) hypertension: Secondary | ICD-10-CM | POA: Diagnosis not present

## 2020-12-22 DIAGNOSIS — Z0001 Encounter for general adult medical examination with abnormal findings: Secondary | ICD-10-CM

## 2020-12-22 DIAGNOSIS — M25471 Effusion, right ankle: Secondary | ICD-10-CM | POA: Diagnosis not present

## 2020-12-22 DIAGNOSIS — E559 Vitamin D deficiency, unspecified: Secondary | ICD-10-CM

## 2020-12-22 DIAGNOSIS — R0602 Shortness of breath: Secondary | ICD-10-CM | POA: Diagnosis not present

## 2020-12-22 DIAGNOSIS — M25472 Effusion, left ankle: Secondary | ICD-10-CM

## 2020-12-22 DIAGNOSIS — R002 Palpitations: Secondary | ICD-10-CM

## 2020-12-22 DIAGNOSIS — R300 Dysuria: Secondary | ICD-10-CM

## 2020-12-22 MED ORDER — MELOXICAM 15 MG PO TABS: 15.0000 mg | ORAL_TABLET | Freq: Every day | ORAL | 2 refills | Status: DC

## 2020-12-22 MED ORDER — ERGOCALCIFEROL 1.25 MG (50000 UT) PO CAPS: ORAL_CAPSULE | ORAL | 3 refills | Status: DC

## 2020-12-22 MED ORDER — FUROSEMIDE 20 MG PO TABS: 20.0000 mg | ORAL_TABLET | Freq: Every day | ORAL | 3 refills | Status: DC | PRN

## 2020-12-22 NOTE — Progress Notes (Signed)
[facility] [place], [place] [address]  Internal MEDICINE  Office Visit Note  Patient Name: Krystal Evans  [id number]  [id number]  Date of Service: 12/24/2020  Chief Complaint  Patient presents with   Annual Exam     HPI Pt is here for routine health maintenance examination -Feels like she cant get a full breath at times, no heart racing that she can really tell. Gets winded. Was previously given metoprolol to take as needed when she had a similar problem several years ago. She has recently been taking it daily due to increased symptoms. -Feet and ankle swelling recently as well. Improves with elevation. Denies any pain or color changes in LE. -Discussed going ahead with EKG in office today and ordering an echo for further eval with lasix to take prn when swelling is increased.  -Sleeps ok, does have some hot flashes at times but not having now. Followed by OBGYN for this and saw them a few months ago for routine gyn exam with pap and breast exam done. -UTD on mammogram and cologuard -Labs reviewed and vit D is very low therefore will start drisdol. LDL also a little elevated however is improved from last year and will continue to work on diet and exercise. Other labs looked good  Current Medication: Outpatient Encounter Medications as of 12/22/2020  Medication Sig   amLODipine (NORVASC) 10 MG tablet Take 1 tablet (10 mg total) by mouth daily.   ergocalciferol (DRISDOL) 1.25 MG (50000 UT) capsule Take one cap q week   furosemide (LASIX) 20 MG tablet Take 1 tablet (20 mg total) by mouth daily as needed.   metoprolol tartrate (LOPRESSOR) 25 MG tablet Take 0.5 tablets (12.5 mg total) by mouth 2 (two) times daily as needed.   olmesartan (BENICAR) 5 MG tablet Take 1 tablet (5 mg total) by mouth at bedtime.   [DISCONTINUED] meloxicam (MOBIC) 15 MG tablet Take 15 mg by mouth daily.   meloxicam (MOBIC) 15 MG tablet Take 1 tablet (15 mg total) by mouth daily.    No facility-administered encounter medications on file as of 12/22/2020.    Surgical History: Past Surgical History:  Procedure Laterality Date   CHOLECYSTECTOMY     CONTINUOUS NERVE MONITORING N/A 08/21/2019   Procedure: FACIAL NERVE MONITORING;  Surgeon: Carloyn Manner, MD;  Location: [facility] ORS;  Service: ENT;  Laterality: N/A;   PAROTIDECTOMY Left 08/21/2019   Procedure: PAROTIDECTOMY;  Surgeon: Carloyn Manner, MD;  Location: [facility] ORS;  Service: ENT;  Laterality: Left;   TUBAL LIGATION      Medical History: Past Medical History:  Diagnosis Date   Abnormal uterine bleeding    Bursitis    Congestive heart failure (CHF) ([place])    Family history of breast cancer 04/19/2012   IBIS 22.29%; 9/22 cancer genetic testing letter sent; literature given at 8/22 appt   GERD (gastroesophageal reflux disease)    Hypertension    Increased risk of breast cancer 04/19/2012   IBIS 22.29   Vitamin D deficiency     Family History: Family History  Problem Relation Age of Onset   Diabetes Mother    Hypertension Mother    Breast cancer Mother        37   Osteoporosis Mother    Glaucoma Mother    Osteoporosis Father    Heart disease Father    Hypertension Father    Hypertension Brother    Diabetes Maternal Aunt    Breast cancer Paternal Aunt 53  again at 52   Diabetes Paternal Aunt       Review of Systems  Constitutional:  Negative for activity change, chills, fatigue and unexpected weight change.  HENT:  Negative for congestion, postnasal drip, rhinorrhea, sneezing and sore throat.   Respiratory:  Positive for shortness of breath. Negative for cough, chest tightness and wheezing.   Cardiovascular:  Positive for palpitations and leg swelling. Negative for chest pain.  Gastrointestinal:  Negative for abdominal pain, constipation, diarrhea, nausea and vomiting.  Endocrine: Negative for cold intolerance, heat intolerance, polydipsia and polyuria.  Genitourinary:  Negative  for dysuria, frequency and urgency.  Musculoskeletal:  Negative for arthralgias, back pain, joint swelling and neck pain.  Skin:  Negative for rash.  Allergic/Immunologic: Negative for environmental allergies.  Neurological:  Negative for dizziness, tremors, numbness and headaches.  Hematological:  Negative for adenopathy. Does not bruise/bleed easily.  Psychiatric/Behavioral:  Negative for behavioral problems (Depression), sleep disturbance and suicidal ideas. The patient is not nervous/anxious.     Vital Signs: BP 140/80   Pulse 86   Temp 97.8 F (36.6 C)   Resp 16   Ht '5\' 3"'  (1.6 m)   Wt 163 lb 6.4 oz (74.1 kg)   SpO2 99%   BMI 28.95 kg/m    Physical Exam Vitals and nursing note reviewed.  Constitutional:      General: She is not in acute distress.    Appearance: She is well-developed. She is not diaphoretic.  HENT:     Head: Normocephalic and atraumatic.     Right Ear: External ear normal.     Left Ear: External ear normal.     Nose: Nose normal.     Mouth/Throat:     Pharynx: No oropharyngeal exudate.  Eyes:     General: No scleral icterus.       Right eye: No discharge.        Left eye: No discharge.     Conjunctiva/sclera: Conjunctivae normal.     Pupils: Pupils are equal, round, and reactive to light.  Neck:     Thyroid: No thyromegaly.     Vascular: No JVD.     Trachea: No tracheal deviation.  Cardiovascular:     Rate and Rhythm: Normal rate and regular rhythm.     Heart sounds: Normal heart sounds. No murmur heard.   No friction rub. No gallop.  Pulmonary:     Effort: Pulmonary effort is normal. No respiratory distress.     Breath sounds: Normal breath sounds. No stridor. No wheezing or rales.  Chest:     Chest wall: No tenderness.  Abdominal:     General: Bowel sounds are normal. There is no distension.     Palpations: Abdomen is soft. There is no mass.     Tenderness: There is no abdominal tenderness. There is no guarding or rebound.   Musculoskeletal:        General: No tenderness or deformity. Normal range of motion.     Cervical back: Normal range of motion and neck supple.     Comments: Min ankle edema bilaterally  Lymphadenopathy:     Cervical: No cervical adenopathy.  Skin:    General: Skin is warm and dry.     Coloration: Skin is not pale.     Findings: No erythema or rash.  Neurological:     Mental Status: She is alert.     Cranial Nerves: No cranial nerve deficit.     Motor: No abnormal muscle tone.  Coordination: Coordination normal.     Deep Tendon Reflexes: Reflexes are normal and symmetric.  Psychiatric:        Behavior: Behavior normal.        Thought Content: Thought content normal.        Judgment: Judgment normal.     LABS: Recent Results (from the past 2160 hour(s))  Comprehensive metabolic panel     Status: None   Collection Time: 12/12/20 11:25 AM  Result Value Ref Range   Glucose 83 70 - 99 mg/dL   BUN 9 6 - 24 mg/dL   Creatinine, Ser 0.86 0.57 - 1.00 mg/dL   eGFR 81 >59 mL/min/1.73   BUN/Creatinine Ratio 10 9 - 23   Sodium 141 134 - 144 mmol/L   Potassium 4.3 3.5 - 5.2 mmol/L   Chloride 105 96 - 106 mmol/L   CO2 23 20 - 29 mmol/L   Calcium 8.9 8.7 - 10.2 mg/dL   Total Protein 6.6 6.0 - 8.5 g/dL   Albumin 4.0 3.8 - 4.9 g/dL   Globulin, Total 2.6 1.5 - 4.5 g/dL   Albumin/Globulin Ratio 1.5 1.2 - 2.2   Bilirubin Total 0.3 0.0 - 1.2 mg/dL   Alkaline Phosphatase 87 44 - 121 IU/L   AST 25 0 - 40 IU/L   ALT 25 0 - 32 IU/L  CBC     Status: None   Collection Time: 12/12/20 11:25 AM  Result Value Ref Range   WBC 6.2 3.4 - 10.8 x10E3/uL   RBC 3.88 3.77 - 5.28 x10E6/uL   Hemoglobin 11.5 11.1 - 15.9 g/dL   Hematocrit 34.9 34.0 - 46.6 %   MCV 90 79 - 97 fL   MCH 29.6 26.6 - 33.0 pg   MCHC 33.0 31.5 - 35.7 g/dL   RDW 13.8 11.7 - 15.4 %   Platelets 286 150 - 450 x10E3/uL  Lipid Panel With LDL/HDL Ratio     Status: Abnormal   Collection Time: 12/12/20 11:25 AM  Result Value Ref  Range   Cholesterol, Total 190 100 - 199 mg/dL   Triglycerides 104 0 - 149 mg/dL   HDL 64 >39 mg/dL   VLDL Cholesterol Cal 19 5 - 40 mg/dL   LDL Chol Calc (NIH) 107 (H) 0 - 99 mg/dL   LDL/HDL Ratio 1.7 0.0 - 3.2 ratio    Comment:                                     LDL/HDL Ratio                                             Men  Women                               1/2 Avg.Risk  1.0    1.5                                   Avg.Risk  3.6    3.2  2X Avg.Risk  6.2    5.0                                3X Avg.Risk  8.0    6.1   T4, free     Status: None   Collection Time: 12/12/20 11:25 AM  Result Value Ref Range   Free T4 0.96 0.82 - 1.77 ng/dL  TSH     Status: None   Collection Time: 12/12/20 11:25 AM  Result Value Ref Range   TSH 2.280 0.450 - 4.500 uIU/mL  VITAMIN D 25 Hydroxy (Vit-D Deficiency, Fractures)     Status: Abnormal   Collection Time: 12/12/20 11:25 AM  Result Value Ref Range   Vit D, 25-Hydroxy 8.5 (L) 30.0 - 100.0 ng/mL    Comment: Vitamin D deficiency has been defined by the [place] and an Endocrine Society practice guideline as a level of serum 25-OH vitamin D less than 20 ng/mL (1,2). The Endocrine Society went on to further define vitamin D insufficiency as a level between 21 and 29 ng/mL (2). 1. IOM (Institute of Medicine). 2010. Dietary reference    intakes for calcium and D. [place]: The    [employer]. 2. Holick MF, [place], Bischoff-Ferrari HA, et al.    Evaluation, treatment, and prevention of vitamin D    deficiency: an Endocrine Society clinical practice    guideline. JCEM. 2011 Jul; 96(7):1911-30.   UA/M w/rflx Culture, Routine     Status: Abnormal (Preliminary result)   Collection Time: 12/22/20  3:14 PM   Specimen: Urine   Urine  Result Value Ref Range   Specific Gravity, UA 1.021 1.005 - 1.030   pH, UA 5.5 5.0 - 7.5   Color, UA Yellow Yellow   Appearance Ur Cloudy (A) Clear    Leukocytes,UA Negative Negative   Protein,UA Trace Negative/Trace   Glucose, UA Negative Negative   Ketones, UA Negative Negative   RBC, UA 2+ (A) Negative   Bilirubin, UA Negative Negative   Urobilinogen, Ur 0.2 0.2 - 1.0 mg/dL   Nitrite, UA Negative Negative   Microscopic Examination See below:     Comment: Microscopic was indicated and was performed.   Urinalysis Reflex Comment     Comment: This specimen has reflexed to a Urine Culture.  Microscopic Examination     Status: Abnormal   Collection Time: 12/22/20  3:14 PM   Urine  Result Value Ref Range   WBC, UA 6-10 (A) 0 - 5 /hpf   RBC 3-10 (A) 0 - 2 /hpf   Epithelial Cells (non renal) >10 (A) 0 - 10 /hpf   Casts None seen None seen /lpf   Bacteria, UA Moderate (A) None seen/Few  Urine Culture, Reflex     Status: None (Preliminary result)   Collection Time: 12/22/20  3:14 PM   Urine  Result Value Ref Range   Urine Culture, Routine WILL FOLLOW         Assessment/Plan: 1. Encounter for general adult medical examination with abnormal findings CPE performed, labs reviewed and UTD on PHM  2. Essential hypertension Stable, continue current medications  3. Palpitations - EKG 12-Lead --low voltage, will order echo for further eval; may continue metoprolol prn. Advised to go to ED for any new or worsening symptoms. - ECHOCARDIOGRAM COMPLETE; Future  4. SOB (shortness of breath) Will order echo; advised to go to ED if any new  or worsening symptoms - ECHOCARDIOGRAM COMPLETE; Future  5. Ankle edema, bilateral May take lasix prn for increased swelling. Will also order echo for further eval - furosemide (LASIX) 20 MG tablet; Take 1 tablet (20 mg total) by mouth daily as needed.  Dispense: 30 tablet; Refill: 3 - ECHOCARDIOGRAM COMPLETE; Future  6. Vitamin D deficiency Will start weekly supplement with drisdol - ergocalciferol (DRISDOL) 1.25 MG (50000 UT) capsule; Take one cap q week  Dispense: 12 capsule; Refill: 3  7.  Dysuria - UA/M w/rflx Culture, Routine - Microscopic Examination - Urine Culture, Reflex   General Counseling: Fronie verbalizes understanding of the findings of todays visit and agrees with plan of treatment. I have discussed any further diagnostic evaluation that may be needed or ordered today. We also reviewed her medications today. she has been encouraged to call the office with any questions or concerns that should arise related to todays visit.    Counseling:    Orders Placed This Encounter  Procedures   Microscopic Examination   Urine Culture, Reflex   UA/M w/rflx Culture, Routine   EKG 12-Lead   ECHOCARDIOGRAM COMPLETE    Meds ordered this encounter  Medications   furosemide (LASIX) 20 MG tablet    Sig: Take 1 tablet (20 mg total) by mouth daily as needed.    Dispense:  30 tablet    Refill:  3   ergocalciferol (DRISDOL) 1.25 MG (50000 UT) capsule    Sig: Take one cap q week    Dispense:  12 capsule    Refill:  3   meloxicam (MOBIC) 15 MG tablet    Sig: Take 1 tablet (15 mg total) by mouth daily.    Dispense:  30 tablet    Refill:  2    This patient was seen by Drema Dallas, PA-C in collaboration with Dr. Clayborn Bigness as a part of collaborative care agreement.  Total time spent:40 Minutes  Time spent includes review of chart, medications, test results, and follow up plan with the patient.     Lavera Guise, MD  Internal Medicine

## 2020-12-25 ENCOUNTER — Encounter: Payer: Managed Care, Other (non HMO) | Admitting: Nurse Practitioner

## 2020-12-27 LAB — UA/M W/RFLX CULTURE, ROUTINE
Bilirubin, UA: NEGATIVE
Glucose, UA: NEGATIVE
Ketones, UA: NEGATIVE
Leukocytes,UA: NEGATIVE
Nitrite, UA: NEGATIVE
Specific Gravity, UA: 1.021 (ref 1.005–1.030)
Urobilinogen, Ur: 0.2 mg/dL (ref 0.2–1.0)
pH, UA: 5.5 (ref 5.0–7.5)

## 2020-12-27 LAB — MICROSCOPIC EXAMINATION
Casts: NONE SEEN /lpf
Epithelial Cells (non renal): >10 /hpf — AB (ref 0–10)

## 2020-12-27 LAB — URINE CULTURE, REFLEX

## 2020-12-31 ENCOUNTER — Telehealth: Payer: Self-pay

## 2020-12-31 NOTE — Telephone Encounter (Signed)
Sent mychart message to confirm 01/07/21 ultrasound appointment-Krystal Evans

## 2021-01-07 ENCOUNTER — Other Ambulatory Visit: Payer: Managed Care, Other (non HMO)

## 2021-01-21 ENCOUNTER — Ambulatory Visit: Payer: Managed Care, Other (non HMO)

## 2021-01-21 ENCOUNTER — Other Ambulatory Visit: Payer: Self-pay

## 2021-01-21 DIAGNOSIS — M25471 Effusion, right ankle: Secondary | ICD-10-CM

## 2021-01-21 DIAGNOSIS — R002 Palpitations: Secondary | ICD-10-CM | POA: Diagnosis not present

## 2021-01-21 DIAGNOSIS — R0602 Shortness of breath: Secondary | ICD-10-CM

## 2021-01-21 DIAGNOSIS — M25472 Effusion, left ankle: Secondary | ICD-10-CM

## 2021-01-22 ENCOUNTER — Ambulatory Visit: Payer: Managed Care, Other (non HMO) | Admitting: Physician Assistant

## 2021-02-05 ENCOUNTER — Other Ambulatory Visit: Payer: Self-pay

## 2021-02-05 ENCOUNTER — Ambulatory Visit: Payer: Managed Care, Other (non HMO) | Admitting: Physician Assistant

## 2021-02-05 ENCOUNTER — Encounter: Payer: Self-pay | Admitting: Physician Assistant

## 2021-02-05 DIAGNOSIS — M722 Plantar fascial fibromatosis: Secondary | ICD-10-CM

## 2021-02-05 DIAGNOSIS — I10 Essential (primary) hypertension: Secondary | ICD-10-CM | POA: Diagnosis not present

## 2021-02-05 DIAGNOSIS — R002 Palpitations: Secondary | ICD-10-CM

## 2021-02-05 MED ORDER — OLMESARTAN MEDOXOMIL 5 MG PO TABS: 5.0000 mg | ORAL_TABLET | Freq: Every day | ORAL | 3 refills | Status: DC

## 2021-02-05 MED ORDER — METOPROLOL TARTRATE 25 MG PO TABS: 12.5000 mg | ORAL_TABLET | Freq: Two times a day (BID) | ORAL | 2 refills | Status: DC | PRN

## 2021-02-05 MED ORDER — AMLODIPINE BESYLATE 10 MG PO TABS: 10.0000 mg | ORAL_TABLET | Freq: Every day | ORAL | 3 refills | Status: DC

## 2021-02-05 NOTE — Progress Notes (Signed)
[facility] [place], [place] [address]  Internal MEDICINE  Office Visit Note  Patient Name: Krystal Evans  [id number]  [id number]  Date of Service: 02/10/2021  Chief Complaint  Patient presents with   Follow-up    echo    HPI Pt is here for routine follow up and echo results -1/2 tab metoprolol taken as needed, only had to take it 1 or 2 times since [place] was done. -Reviewed echo which showed trace/mild AR, with normal EF and diastolic function. Plan to repeat in 24yrs for AR. -Discussed Krystal Evans may be having an irregular beat such as PVCs at times that make it feel abnormal despite reassuring EKG and echo. May need to consider long term heart monitor in order to capture rhythm during symptoms. Has no symptoms in office today. Consider taking 1/2 tab metoprolol daily as preventive if occurring more often. -Sleeping well -Denies any anxiety or CP -Takes meloxicam for feet, but may need injections again for plantar fasciitis  Current Medication: Outpatient Encounter Medications as of 02/05/2021  Medication Sig   ergocalciferol (DRISDOL) 1.25 MG (50000 UT) capsule Take one cap q week   furosemide (LASIX) 20 MG tablet Take 1 tablet (20 mg total) by mouth daily as needed.   meloxicam (MOBIC) 15 MG tablet Take 1 tablet (15 mg total) by mouth daily.   [DISCONTINUED] amLODipine (NORVASC) 10 MG tablet Take 1 tablet (10 mg total) by mouth daily.   [DISCONTINUED] metoprolol tartrate (LOPRESSOR) 25 MG tablet Take 0.5 tablets (12.5 mg total) by mouth 2 (two) times daily as needed.   [DISCONTINUED] olmesartan (BENICAR) 5 MG tablet Take 1 tablet (5 mg total) by mouth at bedtime.   amLODipine (NORVASC) 10 MG tablet Take 1 tablet (10 mg total) by mouth daily.   metoprolol tartrate (LOPRESSOR) 25 MG tablet Take 0.5 tablets (12.5 mg total) by mouth 2 (two) times daily as needed.   olmesartan (BENICAR) 5 MG tablet Take 1 tablet (5 mg total) by mouth at bedtime.   No  facility-administered encounter medications on file as of 02/05/2021.    Surgical History: Past Surgical History:  Procedure Laterality Date   CHOLECYSTECTOMY     CONTINUOUS NERVE MONITORING N/A 08/21/2019   Procedure: FACIAL NERVE MONITORING;  Surgeon: Carloyn Manner, MD;  Location: [facility] ORS;  Service: ENT;  Laterality: N/A;   PAROTIDECTOMY Left 08/21/2019   Procedure: PAROTIDECTOMY;  Surgeon: Carloyn Manner, MD;  Location: [facility] ORS;  Service: ENT;  Laterality: Left;   TUBAL LIGATION      Medical History: Past Medical History:  Diagnosis Date   Abnormal uterine bleeding    Bursitis    Congestive heart failure (CHF) ([place])    Family history of breast cancer 04/19/2012   IBIS 22.29%; 9/22 cancer genetic testing letter sent; literature given at 8/22 appt   GERD (gastroesophageal reflux disease)    Hypertension    Increased risk of breast cancer 04/19/2012   IBIS 22.29   Vitamin D deficiency     Family History: Family History  Problem Relation Age of Onset   Diabetes Mother    Hypertension Mother    Breast cancer Mother        71   Osteoporosis Mother    Glaucoma Mother    Osteoporosis Father    Heart disease Father    Hypertension Father    Hypertension Brother    Diabetes Maternal Aunt    Breast cancer Paternal Aunt 90  again at 57   Diabetes Paternal Aunt     Social History   Socioeconomic History   Marital status: Married    Spouse name: Not on file   Number of children: 2   Years of education: Not on file   Highest education level: Not on file  Occupational History   Occupation: A/R rep    Employer: [employer]  Tobacco Use   Smoking status: Never   Smokeless tobacco: Never  Vaping Use   Vaping Use: Never used  Substance and Sexual Activity   Alcohol use: No   Drug use: No   Sexual activity: Yes    Birth control/protection: Surgical  Other Topics Concern   Not on file  Social History Narrative   Not on file   Social Determinants of  Health   Financial Resource Strain: Not on file  Food Insecurity: Not on file  Transportation Needs: Not on file  Physical Activity: Not on file  Stress: Not on file  Social Connections: Not on file  Intimate Partner Violence: Not on file      Review of Systems  Constitutional:  Negative for activity change, chills, fatigue and unexpected weight change.  HENT:  Negative for congestion, postnasal drip, rhinorrhea, sneezing and sore throat.   Respiratory:  Negative for cough, chest tightness and shortness of breath.   Cardiovascular:  Negative for chest pain and palpitations.  Gastrointestinal:  Negative for abdominal pain, constipation, diarrhea, nausea and vomiting.  Endocrine: Negative for cold intolerance, heat intolerance, polydipsia and polyuria.  Genitourinary:  Negative for dysuria, frequency and urgency.  Musculoskeletal:  Negative for arthralgias, back pain, joint swelling and neck pain.       Foot pain due to plantar fasciitis  Skin:  Negative for rash.  Allergic/Immunologic: Negative for environmental allergies.  Neurological:  Negative for dizziness, tremors, numbness and headaches.  Hematological:  Negative for adenopathy. Does not bruise/bleed easily.  Psychiatric/Behavioral:  Negative for behavioral problems (Depression), sleep disturbance and suicidal ideas. The patient is not nervous/anxious.    Vital Signs: BP 127/84    Pulse 79    Temp 97.8 F (36.6 C)    Resp 16    Ht 5\' 3"  (1.6 m)    Wt 161 lb (73 kg)    SpO2 97%    BMI 28.52 kg/m    Physical Exam Constitutional:      General: Krystal Evans is not in acute distress.    Appearance: Krystal Evans is well-developed. Krystal Evans is not diaphoretic.  HENT:     Head: Normocephalic and atraumatic.     Mouth/Throat:     Pharynx: No oropharyngeal exudate.  Eyes:     Pupils: Pupils are equal, round, and reactive to light.  Neck:     Thyroid: No thyromegaly.     Vascular: No JVD.     Trachea: No tracheal deviation.  Cardiovascular:      Rate and Rhythm: Normal rate and regular rhythm.     Heart sounds: Normal heart sounds. No murmur heard.   No friction rub. No gallop.  Pulmonary:     Effort: Pulmonary effort is normal. No respiratory distress.     Breath sounds: No wheezing or rales.  Chest:     Chest wall: No tenderness.  Abdominal:     General: Bowel sounds are normal.     Palpations: Abdomen is soft.  Musculoskeletal:        General: Normal range of motion.     Cervical back: Normal range of  motion and neck supple.  Lymphadenopathy:     Cervical: No cervical adenopathy.  Skin:    General: Skin is warm and dry.  Neurological:     Mental Status: Krystal Evans is alert and oriented to person, place, and time.     Cranial Nerves: No cranial nerve deficit.  Psychiatric:        Behavior: Behavior normal.        Thought Content: Thought content normal.        Judgment: Judgment normal.       Assessment/Plan: 1. Essential hypertension Stable will continue current medications - olmesartan (BENICAR) 5 MG tablet; Take 1 tablet (5 mg total) by mouth at bedtime.  Dispense: 90 tablet; Refill: 3 - amLODipine (NORVASC) 10 MG tablet; Take 1 tablet (10 mg total) by mouth daily.  Dispense: 90 tablet; Refill: 3  2. Palpitations May continue 1/2 tab metoprolol as needed, though if increased occurrences may consider taking 1/2 tab daily for prevention and consider long term heart monitor.  3. Plantar fasciitis, bilateral Followed by podiatry   General Counseling: Delight Hoh understanding of the findings of todays visit and agrees with plan of treatment. I have discussed any further diagnostic evaluation that may be needed or ordered today. We also reviewed her medications today. Krystal Evans has been encouraged to call the office with any questions or concerns that should arise related to todays visit.    No orders of the defined types were placed in this encounter.   Meds ordered this encounter  Medications   olmesartan  (BENICAR) 5 MG tablet    Sig: Take 1 tablet (5 mg total) by mouth at bedtime.    Dispense:  90 tablet    Refill:  3   metoprolol tartrate (LOPRESSOR) 25 MG tablet    Sig: Take 0.5 tablets (12.5 mg total) by mouth 2 (two) times daily as needed.    Dispense:  30 tablet    Refill:  2   amLODipine (NORVASC) 10 MG tablet    Sig: Take 1 tablet (10 mg total) by mouth daily.    Dispense:  90 tablet    Refill:  3    This patient was seen by Drema Dallas, PA-C in collaboration with Dr. Clayborn Bigness as a part of collaborative care agreement.   Total time spent:30 Minutes Time spent includes review of chart, medications, test results, and follow up plan with the patient.      Dr Lavera Guise Internal medicine

## 2021-05-18 ENCOUNTER — Other Ambulatory Visit: Payer: Self-pay | Admitting: Physician Assistant

## 2021-05-18 ENCOUNTER — Other Ambulatory Visit: Payer: Self-pay

## 2021-05-18 DIAGNOSIS — I10 Essential (primary) hypertension: Secondary | ICD-10-CM

## 2021-05-18 MED ORDER — OLMESARTAN MEDOXOMIL 5 MG PO TABS: 5.0000 mg | ORAL_TABLET | Freq: Every day | ORAL | 3 refills | Status: DC

## 2021-05-18 MED ORDER — AMLODIPINE BESYLATE 10 MG PO TABS: 10.0000 mg | ORAL_TABLET | Freq: Every day | ORAL | 3 refills | Status: DC

## 2021-06-04 ENCOUNTER — Ambulatory Visit: Payer: Managed Care, Other (non HMO) | Admitting: Physician Assistant

## 2021-06-18 ENCOUNTER — Encounter: Payer: Self-pay | Admitting: Physician Assistant

## 2021-06-18 ENCOUNTER — Ambulatory Visit: Payer: Managed Care, Other (non HMO) | Admitting: Physician Assistant

## 2021-06-18 DIAGNOSIS — M722 Plantar fascial fibromatosis: Secondary | ICD-10-CM

## 2021-06-18 DIAGNOSIS — R002 Palpitations: Secondary | ICD-10-CM | POA: Diagnosis not present

## 2021-06-18 DIAGNOSIS — I10 Essential (primary) hypertension: Secondary | ICD-10-CM

## 2021-06-18 DIAGNOSIS — Z23 Encounter for immunization: Secondary | ICD-10-CM

## 2021-06-18 MED ORDER — ZOSTER VAC RECOMB ADJUVANTED 50 MCG/0.5ML IM SUSR: 0.5000 mL | Freq: Once | INTRAMUSCULAR | 0 refills | Status: AC

## 2021-06-18 NOTE — Progress Notes (Signed)
[facility] [place], [place] [address]  Internal MEDICINE  Office Visit Note  Patient Name: Krystal Evans  [id number]  [id number]  Date of Service: 06/18/2021  Chief Complaint  Patient presents with   Follow-up   Gastroesophageal Reflux   Hypertension    HPI Pt is here for routine follow up -Doing well today with no complaints -BP not checked at home, but stable in office and will check it occasionally if at the pharmacy -Taking 1/2 tab metoprolol as needed and hasn't needed it often -not taking lasix at all -Continues to have some foot pain from plantar fasciitis and was receiving steroid injections, but hasn't had one recently and has been managing. Discussed trying rolling feet on an ice water bottle as another option at home to help symptoms -due for shingles vaccine and will send to pharmacy when she is ready  Current Medication: Outpatient Encounter Medications as of 06/18/2021  Medication Sig   amLODipine (NORVASC) 10 MG tablet Take 1 tablet (10 mg total) by mouth daily.   ergocalciferol (DRISDOL) 1.25 MG (50000 UT) capsule Take one cap q week   furosemide (LASIX) 20 MG tablet Take 1 tablet (20 mg total) by mouth daily as needed.   meloxicam (MOBIC) 15 MG tablet Take 1 tablet (15 mg total) by mouth daily.   metoprolol tartrate (LOPRESSOR) 25 MG tablet TAKE 1/2 TABLET(12.5 MG) BY MOUTH TWICE DAILY AS NEEDED   olmesartan (BENICAR) 5 MG tablet Take 1 tablet (5 mg total) by mouth at bedtime.   [DISCONTINUED] Zoster Vaccine Adjuvanted [facility]) injection Inject 0.5 mLs into the muscle once.   Zoster Vaccine Adjuvanted [facility]) injection Inject 0.5 mLs into the muscle once for 1 dose.   No facility-administered encounter medications on file as of 06/18/2021.    Surgical History: Past Surgical History:  Procedure Laterality Date   CHOLECYSTECTOMY     CONTINUOUS NERVE MONITORING N/A 08/21/2019   Procedure: FACIAL NERVE MONITORING;  Surgeon:  Carloyn Manner, MD;  Location: [facility] ORS;  Service: ENT;  Laterality: N/A;   PAROTIDECTOMY Left 08/21/2019   Procedure: PAROTIDECTOMY;  Surgeon: Carloyn Manner, MD;  Location: [facility] ORS;  Service: ENT;  Laterality: Left;   TUBAL LIGATION      Medical History: Past Medical History:  Diagnosis Date   Abnormal uterine bleeding    Bursitis    Congestive heart failure (CHF) ([place])    Family history of breast cancer 04/19/2012   IBIS 22.29%; 9/22 cancer genetic testing letter sent; literature given at 8/22 appt   GERD (gastroesophageal reflux disease)    Hypertension    Increased risk of breast cancer 04/19/2012   IBIS 22.29   Vitamin D deficiency     Family History: Family History  Problem Relation Age of Onset   Diabetes Mother    Hypertension Mother    Breast cancer Mother        54   Osteoporosis Mother    Glaucoma Mother    Osteoporosis Father    Heart disease Father    Hypertension Father    Hypertension Brother    Diabetes Maternal Aunt    Breast cancer Paternal Aunt 46       again at 75   Diabetes Paternal Aunt     Social History   Socioeconomic History   Marital status: Married    Spouse name: Not on file   Number of children: 2   Years of education: Not on file   Highest education level:  Not on file  Occupational History   Occupation: A/R rep    Employer: [employer]  Tobacco Use   Smoking status: Never   Smokeless tobacco: Never  Vaping Use   Vaping Use: Never used  Substance and Sexual Activity   Alcohol use: No   Drug use: No   Sexual activity: Yes    Birth control/protection: Surgical  Other Topics Concern   Not on file  Social History Narrative   Not on file   Social Determinants of Health   Financial Resource Strain: Not on file  Food Insecurity: Not on file  Transportation Needs: Not on file  Physical Activity: Not on file  Stress: Not on file  Social Connections: Not on file  Intimate Partner Violence: Not on file      Review of  Systems  Constitutional:  Negative for activity change, chills, fatigue and unexpected weight change.  HENT:  Negative for congestion, postnasal drip, rhinorrhea, sneezing and sore throat.   Respiratory:  Negative for cough, chest tightness and shortness of breath.   Cardiovascular:  Negative for chest pain and palpitations.  Gastrointestinal:  Negative for abdominal pain, constipation, diarrhea, nausea and vomiting.  Endocrine: Negative for cold intolerance, heat intolerance, polydipsia and polyuria.  Genitourinary:  Negative for dysuria, frequency and urgency.  Musculoskeletal:  Negative for arthralgias, back pain, joint swelling and neck pain.       Foot pain due to plantar fasciitis  Skin:  Negative for rash.  Allergic/Immunologic: Negative for environmental allergies.  Neurological:  Negative for dizziness, tremors, numbness and headaches.  Hematological:  Negative for adenopathy. Does not bruise/bleed easily.  Psychiatric/Behavioral:  Negative for behavioral problems (Depression), sleep disturbance and suicidal ideas. The patient is not nervous/anxious.    Vital Signs: BP 123/79   Pulse 82   Temp 98.3 F (36.8 C)   Resp 16   Ht '5\' 3"'$  (1.6 m)   Wt 163 lb (73.9 kg)   SpO2 100%   BMI 28.87 kg/m    Physical Exam Constitutional:      General: She is not in acute distress.    Appearance: She is well-developed. She is not diaphoretic.  HENT:     Head: Normocephalic and atraumatic.     Mouth/Throat:     Pharynx: No oropharyngeal exudate.  Eyes:     Pupils: Pupils are equal, round, and reactive to light.  Neck:     Thyroid: No thyromegaly.     Vascular: No JVD.     Trachea: No tracheal deviation.  Cardiovascular:     Rate and Rhythm: Normal rate and regular rhythm.     Heart sounds: Normal heart sounds. No murmur heard.   No friction rub. No gallop.  Pulmonary:     Effort: Pulmonary effort is normal. No respiratory distress.     Breath sounds: No wheezing or rales.   Chest:     Chest wall: No tenderness.  Abdominal:     General: Bowel sounds are normal.     Palpations: Abdomen is soft.  Musculoskeletal:        General: Normal range of motion.     Cervical back: Normal range of motion and neck supple.  Lymphadenopathy:     Cervical: No cervical adenopathy.  Skin:    General: Skin is warm and dry.  Neurological:     Mental Status: She is alert and oriented to person, place, and time.     Cranial Nerves: No cranial nerve deficit.  Psychiatric:  Behavior: Behavior normal.        Thought Content: Thought content normal.        Judgment: Judgment normal.       Assessment/Plan: 1. Essential hypertension Stable, continue current medications  2. Plantar fasciitis, bilateral Followed by podiatry and may try ice water bottle  3. Palpitations Improved, may take 1/2 tab metoprolol as needed still  4. Need for shingles vaccine - Zoster Vaccine Adjuvanted [facility]) injection; Inject 0.5 mLs into the muscle once for 1 dose.  Dispense: 0.5 mL; Refill: 0   General Counseling: Aarini verbalizes understanding of the findings of todays visit and agrees with plan of treatment. I have discussed any further diagnostic evaluation that may be needed or ordered today. We also reviewed her medications today. she has been encouraged to call the office with any questions or concerns that should arise related to todays visit.    No orders of the defined types were placed in this encounter.   Meds ordered this encounter  Medications   Zoster Vaccine Adjuvanted [facility]) injection    Sig: Inject 0.5 mLs into the muscle once for 1 dose.    Dispense:  0.5 mL    Refill:  0    This patient was seen by Drema Dallas, PA-C in collaboration with Dr. Clay[place] Bigness as a part of collaborative care agreement.   Total time spent:30 Minutes Time spent includes review of chart, medications, test results, and follow up plan with the patient.      Dr Lavera Guise Internal medicine

## 2021-07-27 ENCOUNTER — Other Ambulatory Visit: Payer: Self-pay

## 2021-07-27 ENCOUNTER — Other Ambulatory Visit: Payer: Self-pay | Admitting: Physician Assistant

## 2021-07-27 DIAGNOSIS — Z1231 Encounter for screening mammogram for malignant neoplasm of breast: Secondary | ICD-10-CM

## 2021-07-27 MED ORDER — MELOXICAM 15 MG PO TABS: 15.0000 mg | ORAL_TABLET | Freq: Every day | ORAL | 2 refills | Status: DC

## 2021-08-24 ENCOUNTER — Ambulatory Visit
Admission: RE | Admit: 2021-08-24 | Discharge: 2021-08-24 | Disposition: A | Discharge status: Home / Self Care | Payer: Managed Care, Other (non HMO) | Source: Ambulatory Visit | Attending: Physician Assistant | Admitting: Physician Assistant

## 2021-08-24 DIAGNOSIS — Z1231 Encounter for screening mammogram for malignant neoplasm of breast: Secondary | ICD-10-CM | POA: Diagnosis present

## 2021-10-19 ENCOUNTER — Other Ambulatory Visit: Payer: Self-pay | Admitting: Physician Assistant

## 2021-11-23 ENCOUNTER — Other Ambulatory Visit: Payer: Self-pay

## 2021-11-23 MED ORDER — MELOXICAM 15 MG PO TABS: ORAL_TABLET | ORAL | 0 refills | Status: DC

## 2021-12-07 ENCOUNTER — Encounter: Payer: Managed Care, Other (non HMO) | Admitting: Physician Assistant

## 2021-12-24 ENCOUNTER — Ambulatory Visit (INDEPENDENT_AMBULATORY_CARE_PROVIDER_SITE_OTHER): Payer: Managed Care, Other (non HMO) | Admitting: Physician Assistant

## 2021-12-24 ENCOUNTER — Encounter: Payer: Self-pay | Admitting: Physician Assistant

## 2021-12-24 VITALS — BP 130/80 | HR 82 | Temp 98.2°F | Resp 16 | Ht 63.0 in | Wt 165.0 lb

## 2021-12-24 DIAGNOSIS — R7989 Other specified abnormal findings of blood chemistry: Secondary | ICD-10-CM

## 2021-12-24 DIAGNOSIS — R7303 Prediabetes: Secondary | ICD-10-CM

## 2021-12-24 DIAGNOSIS — I10 Essential (primary) hypertension: Secondary | ICD-10-CM | POA: Diagnosis not present

## 2021-12-24 DIAGNOSIS — R5383 Other fatigue: Secondary | ICD-10-CM

## 2021-12-24 DIAGNOSIS — Z0001 Encounter for general adult medical examination with abnormal findings: Secondary | ICD-10-CM

## 2021-12-24 DIAGNOSIS — E559 Vitamin D deficiency, unspecified: Secondary | ICD-10-CM | POA: Diagnosis not present

## 2021-12-24 DIAGNOSIS — E782 Mixed hyperlipidemia: Secondary | ICD-10-CM

## 2021-12-24 DIAGNOSIS — R300 Dysuria: Secondary | ICD-10-CM

## 2021-12-24 DIAGNOSIS — M542 Cervicalgia: Secondary | ICD-10-CM

## 2021-12-24 DIAGNOSIS — Z01419 Encounter for gynecological examination (general) (routine) without abnormal findings: Secondary | ICD-10-CM

## 2021-12-24 MED ORDER — MELOXICAM 15 MG PO TABS: ORAL_TABLET | ORAL | 2 refills | Status: DC

## 2021-12-24 NOTE — Progress Notes (Signed)
[facility] [place], [place] [address]  Internal MEDICINE  Office Visit Note  Patient Name: Krystal Evans  [id number]  [id number]  Date of Service: 12/29/2021  Chief Complaint  Patient presents with   Annual Exam   Hypertension   Neck Pain    Left going on for few months      HPI Pt is here for routine health maintenance examination -Left sided neck pain for a few months. Not constant. Comes and goes. Already taking mobic. Will try heating pad, stretching, and improving posture -Bp stable -Still needs to have shingles vaccine -sleeping ok -Working out 2x per week for 6mns -plans to follow up with podiatry for plantar fasciitis  -due for routine fasting labs  Current Medication: Outpatient Encounter Medications as of 12/24/2021  Medication Sig   amLODipine (NORVASC) 10 MG tablet Take 1 tablet (10 mg total) by mouth daily.   ergocalciferol (DRISDOL) 1.25 MG (50000 UT) capsule Take one cap q week   furosemide (LASIX) 20 MG tablet Take 1 tablet (20 mg total) by mouth daily as needed.   metoprolol tartrate (LOPRESSOR) 25 MG tablet TAKE 1/2 TABLET(12.5 MG) BY MOUTH TWICE DAILY AS NEEDED   olmesartan (BENICAR) 5 MG tablet Take 1 tablet (5 mg total) by mouth at bedtime.   [DISCONTINUED] meloxicam (MOBIC) 15 MG tablet TAKE 1 TABLET(15 MG) BY MOUTH DAILY   meloxicam (MOBIC) 15 MG tablet TAKE 1 TABLET(15 MG) BY MOUTH DAILY   No facility-administered encounter medications on file as of 12/24/2021.    Surgical History: Past Surgical History:  Procedure Laterality Date   CHOLECYSTECTOMY     CONTINUOUS NERVE MONITORING N/A 08/21/2019   Procedure: FACIAL NERVE MONITORING;  Surgeon: VCarloyn Manner MD;  Location: [facility] ORS;  Service: ENT;  Laterality: N/A;   PAROTIDECTOMY Left 08/21/2019   Procedure: PAROTIDECTOMY;  Surgeon: VCarloyn Manner MD;  Location: [facility] ORS;  Service: ENT;  Laterality: Left;   TUBAL LIGATION      Medical History: Past  Medical History:  Diagnosis Date   Abnormal uterine bleeding    Bursitis    Congestive heart failure (CHF) ([place]    Family history of breast cancer 04/19/2012   IBIS 22.29%; 9/22 cancer genetic testing letter sent; literature given at 8/22 appt   GERD (gastroesophageal reflux disease)    Hypertension    Increased risk of breast cancer 04/19/2012   IBIS 22.29   Vitamin D deficiency     Family History: Family History  Problem Relation Age of Onset   Diabetes Mother    Hypertension Mother    Breast cancer Mother        267  Osteoporosis Mother    Glaucoma Mother    Osteoporosis Father    Heart disease Father    Hypertension Father    Hypertension Brother    Diabetes Maternal Aunt    Breast cancer Paternal Aunt 581      again at 610  Diabetes Paternal Aunt       Review of Systems  Constitutional:  Negative for activity change, chills, fatigue and unexpected weight change.  HENT:  Negative for congestion, postnasal drip, rhinorrhea, sneezing and sore throat.   Respiratory:  Negative for cough, chest tightness and shortness of breath.   Cardiovascular:  Negative for chest pain and palpitations.  Gastrointestinal:  Negative for abdominal pain, constipation, diarrhea, nausea and vomiting.  Endocrine: Negative for cold intolerance, heat intolerance, polydipsia and polyuria.  Genitourinary:  Negative  for dysuria, frequency and urgency.  Musculoskeletal:  Positive for arthralgias. Negative for back pain, joint swelling and neck pain.       Foot pain due to plantar fasciitis  Skin:  Negative for rash.  Allergic/Immunologic: Negative for environmental allergies.  Neurological:  Negative for dizziness, tremors, numbness and headaches.  Hematological:  Negative for adenopathy. Does not bruise/bleed easily.  Psychiatric/Behavioral:  Negative for behavioral problems (Depression), sleep disturbance and suicidal ideas. The patient is not nervous/anxious.      Vital Signs: BP 130/80    Pulse 82   Temp 98.2 F (36.8 C)   Resp 16   Ht '5\' 3"'$  (1.6 m)   Wt 165 lb (74.8 kg)   SpO2 99%   BMI 29.23 kg/m    Physical Exam Vitals and nursing note reviewed.  Constitutional:      General: She is not in acute distress.    Appearance: Normal appearance. She is well-developed. She is not diaphoretic.  HENT:     Head: Normocephalic and atraumatic.     Mouth/Throat:     Pharynx: No oropharyngeal exudate.  Eyes:     Pupils: Pupils are equal, round, and reactive to light.  Neck:     Thyroid: No thyromegaly.     Vascular: No JVD.     Trachea: No tracheal deviation.  Cardiovascular:     Rate and Rhythm: Normal rate and regular rhythm.     Heart sounds: Normal heart sounds. No murmur heard.    No friction rub. No gallop.  Pulmonary:     Effort: Pulmonary effort is normal. No respiratory distress.     Breath sounds: No wheezing or rales.  Chest:     Chest wall: No tenderness.  Breasts:    Right: Normal. No mass.     Left: Normal. No mass.  Abdominal:     General: Bowel sounds are normal.     Palpations: Abdomen is soft.     Tenderness: There is no abdominal tenderness.  Musculoskeletal:        General: Normal range of motion.     Cervical back: Normal range of motion and neck supple. Tenderness present.  Lymphadenopathy:     Cervical: No cervical adenopathy.  Skin:    General: Skin is warm and dry.  Neurological:     Mental Status: She is alert and oriented to person, place, and time.     Cranial Nerves: No cranial nerve deficit.  Psychiatric:        Behavior: Behavior normal.        Thought Content: Thought content normal.        Judgment: Judgment normal.      LABS: Recent Results (from the past 2160 hour(s))  UA/M w/rflx Culture, Routine     Status: None   Collection Time: 12/24/21  3:36 PM   Specimen: Urine   Urine  Result Value Ref Range   Specific Gravity, UA CANCELED     Comment: Test not performed. Insufficient specimen to perform or  complete analysis.  Result canceled by the ancillary.    pH, UA CANCELED     Comment: Test not performed. Insufficient specimen to perform or complete analysis.  Result canceled by the ancillary.    Color, UA CANCELED     Comment: Test not performed. Insufficient specimen to perform or complete analysis.  Result canceled by the ancillary.    Appearance Ur CANCELED     Comment: Test not performed. Insufficient specimen to perform or  complete analysis.  Result canceled by the ancillary.    Leukocytes,UA CANCELED     Comment: Test not performed. Insufficient specimen to perform or complete analysis.  Result canceled by the ancillary.    Protein,UA CANCELED     Comment: Test not performed. Insufficient specimen to perform or complete analysis.  Result canceled by the ancillary.    Glucose, UA CANCELED     Comment: Test not performed. Insufficient specimen to perform or complete analysis.  Result canceled by the ancillary.    Ketones, UA CANCELED     Comment: Test not performed. Insufficient specimen to perform or complete analysis.  Result canceled by the ancillary.    RBC, UA CANCELED     Comment: Test not performed. Insufficient specimen to perform or complete analysis.  Result canceled by the ancillary.    Bilirubin, UA CANCELED     Comment: Test not performed. Insufficient specimen to perform or complete analysis.  Result canceled by the ancillary.    Urobilinogen, Ur CANCELED mg/dL    Comment: Test not performed. Insufficient specimen to perform or complete analysis.  Result canceled by the ancillary.    Nitrite, UA CANCELED     Comment: Test not performed. Insufficient specimen to perform or complete analysis.  Result canceled by the ancillary.    Microscopic Examination CANCELED     Comment: Test not performed. Insufficient specimen to perform or complete analysis.  Result canceled by the ancillary.    Microscopic Examination CANCELED      Comment: Test not performed. Insufficient specimen to perform or complete analysis.  Result canceled by the ancillary.    Urinalysis Reflex CANCELED     Comment: Test not performed. Insufficient specimen to perform or complete analysis.  Result canceled by the ancillary.         Assessment/Plan: 1. Encounter for general adult medical examination with abnormal findings CPE performed, Due for shingles vaccine and routine labs, otherwise UTD on PHM - VITAMIN D 25 Hydroxy (Vit-D Deficiency, Fractures) - TSH + free T4 - Lipid Panel With LDL/HDL Ratio - CBC w/Diff/Platelet - Comprehensive metabolic panel - Hgb A2Q w/o eAG  2. Essential hypertension Stable, continue current medication  3. Musculoskeletal neck pain Will try heating pad, gentle stretching, and working on posture. Already taking mobic  4. Prediabetes - Hgb A1C w/o eAG  5. Vitamin D deficiency - VITAMIN D 25 Hydroxy (Vit-D Deficiency, Fractures)  6. Mixed hyperlipidemia - Lipid Panel With LDL/HDL Ratio  7. Abnormal thyroid blood test - TSH + free T4  8. Other fatigue - VITAMIN D 25 Hydroxy (Vit-D Deficiency, Fractures) - TSH + free T4 - Lipid Panel With LDL/HDL Ratio - CBC w/Diff/Platelet - Comprehensive metabolic panel - Hgb J3H w/o eAG  9. Dysuria - UA/M w/rflx Culture, Routine  10. Visit for gynecologic examination Breast exam performed   General Counseling: Gricelda verbalizes understanding of the findings of todays visit and agrees with plan of treatment. I have discussed any further diagnostic evaluation that may be needed or ordered today. We also reviewed her medications today. she has been encouraged to call the office with any questions or concerns that should arise related to todays visit.    Counseling:    Orders Placed This Encounter  Procedures   VITAMIN D 25 Hydroxy (Vit-D Deficiency, Fractures)   TSH + free T4   Lipid Panel With LDL/HDL Ratio   CBC w/Diff/Platelet    Comprehensive metabolic panel   Hgb L4T w/o eAG   UA/M  w/rflx Culture, Routine    Meds ordered this encounter  Medications   meloxicam (MOBIC) 15 MG tablet    Sig: TAKE 1 TABLET(15 MG) BY MOUTH DAILY    Dispense:  30 tablet    Refill:  2    This patient was seen by Drema Dallas, PA-C in collaboration with Dr. Clay[place] Bigness as a part of collaborative care agreement.  Total time spent:35 Minutes  Time spent includes review of chart, medications, test results, and follow up plan with the patient.     Lavera Guise, MD  Internal Medicine

## 2021-12-28 LAB — UA/M W/RFLX CULTURE, ROUTINE

## 2022-02-25 ENCOUNTER — Ambulatory Visit (INDEPENDENT_AMBULATORY_CARE_PROVIDER_SITE_OTHER): Payer: Managed Care, Other (non HMO) | Admitting: Physician Assistant

## 2022-02-25 ENCOUNTER — Encounter: Payer: Self-pay | Admitting: Physician Assistant

## 2022-02-25 VITALS — BP 130/88 | HR 87 | Temp 98.3°F | Resp 16 | Ht 63.0 in | Wt 168.8 lb

## 2022-02-25 DIAGNOSIS — I10 Essential (primary) hypertension: Secondary | ICD-10-CM | POA: Diagnosis not present

## 2022-02-25 DIAGNOSIS — E559 Vitamin D deficiency, unspecified: Secondary | ICD-10-CM | POA: Diagnosis not present

## 2022-02-25 DIAGNOSIS — M542 Cervicalgia: Secondary | ICD-10-CM

## 2022-02-25 DIAGNOSIS — M545 Low back pain, unspecified: Secondary | ICD-10-CM

## 2022-02-25 MED ORDER — MELOXICAM 15 MG PO TABS: ORAL_TABLET | ORAL | 2 refills | Status: DC

## 2022-02-25 MED ORDER — AMLODIPINE BESYLATE 10 MG PO TABS: 10.0000 mg | ORAL_TABLET | Freq: Every day | ORAL | 3 refills | Status: DC

## 2022-02-25 MED ORDER — ERGOCALCIFEROL 1.25 MG (50000 UT) PO CAPS: ORAL_CAPSULE | ORAL | 3 refills | Status: DC

## 2022-02-25 MED ORDER — OLMESARTAN MEDOXOMIL 5 MG PO TABS: 5.0000 mg | ORAL_TABLET | Freq: Every day | ORAL | 3 refills | Status: DC

## 2022-02-25 MED ORDER — METOPROLOL TARTRATE 25 MG PO TABS: ORAL_TABLET | ORAL | 2 refills | Status: DC

## 2022-02-25 NOTE — Progress Notes (Signed)
[facility] [place], [place] [address]  Internal MEDICINE  Office Visit Note  Patient Name: Krystal Evans  [id number]  [id number]  Date of Service: 03/03/2022  Chief Complaint  Patient presents with   Follow-up    Pt did not do labs   Gastroesophageal Reflux   Hypertension   Quality Metric Gaps    TDAP   Back Pain    Left side back pain that radiates up to neck. Ongoing for 1 month.    HPI Pt is here for routine follow up -Some left side low back pain and burning that radiates into left side of neck and has been having some headaches. Been going on about a month. Taking meloxicam -intermittent, no known injury -does not want muscle relaxer at this time since they make her groggy next morning. Could try just '5mg'$  flexeril if needed and may call if she would like to try this. Will try heating pad and topicals first. -Will have labs tomorrow  Current Medication: Outpatient Encounter Medications as of 02/25/2022  Medication Sig   [DISCONTINUED] amLODipine (NORVASC) 10 MG tablet Take 1 tablet (10 mg total) by mouth daily.   [DISCONTINUED] ergocalciferol (DRISDOL) 1.25 MG (50000 UT) capsule Take one cap q week   [DISCONTINUED] furosemide (LASIX) 20 MG tablet Take 1 tablet (20 mg total) by mouth daily as needed.   [DISCONTINUED] meloxicam (MOBIC) 15 MG tablet TAKE 1 TABLET(15 MG) BY MOUTH DAILY   [DISCONTINUED] metoprolol tartrate (LOPRESSOR) 25 MG tablet TAKE 1/2 TABLET(12.5 MG) BY MOUTH TWICE DAILY AS NEEDED   [DISCONTINUED] olmesartan (BENICAR) 5 MG tablet Take 1 tablet (5 mg total) by mouth at bedtime.   amLODipine (NORVASC) 10 MG tablet Take 1 tablet (10 mg total) by mouth daily.   ergocalciferol (DRISDOL) 1.25 MG (50000 UT) capsule Take one cap q week   meloxicam (MOBIC) 15 MG tablet TAKE 1 TABLET(15 MG) BY MOUTH DAILY   metoprolol tartrate (LOPRESSOR) 25 MG tablet TAKE 1/2 TABLET(12.5 MG) BY MOUTH TWICE DAILY AS NEEDED   olmesartan (BENICAR) 5 MG  tablet Take 1 tablet (5 mg total) by mouth at bedtime.   No facility-administered encounter medications on file as of 02/25/2022.    Surgical History: Past Surgical History:  Procedure Laterality Date   CHOLECYSTECTOMY     CONTINUOUS NERVE MONITORING N/A 08/21/2019   Procedure: FACIAL NERVE MONITORING;  Surgeon: Carloyn Manner, MD;  Location: [facility] ORS;  Service: ENT;  Laterality: N/A;   PAROTIDECTOMY Left 08/21/2019   Procedure: PAROTIDECTOMY;  Surgeon: Carloyn Manner, MD;  Location: [facility] ORS;  Service: ENT;  Laterality: Left;   TUBAL LIGATION      Medical History: Past Medical History:  Diagnosis Date   Abnormal uterine bleeding    Bursitis    Congestive heart failure (CHF) ([place])    Family history of breast cancer 04/19/2012   IBIS 22.29%; 9/22 cancer genetic testing letter sent; literature given at 8/22 appt   GERD (gastroesophageal reflux disease)    Hypertension    Increased risk of breast cancer 04/19/2012   IBIS 22.29   Vitamin D deficiency     Family History: Family History  Problem Relation Age of Onset   Diabetes Mother    Hypertension Mother    Breast cancer Mother        31   Osteoporosis Mother    Glaucoma Mother    Osteoporosis Father    Heart disease Father    Hypertension Father    Hypertension  Brother    Diabetes Maternal Aunt    Breast cancer Paternal Aunt 13       again at 64   Diabetes Paternal Aunt     Social History   Socioeconomic History   Marital status: Married    Spouse name: Not on file   Number of children: 2   Years of education: Not on file   Highest education level: Not on file  Occupational History   Occupation: A/R rep    Employer: [employer]  Tobacco Use   Smoking status: Never   Smokeless tobacco: Never  Vaping Use   Vaping Use: Never used  Substance and Sexual Activity   Alcohol use: No   Drug use: No   Sexual activity: Yes    Birth control/protection: Surgical  Other Topics Concern   Not on file  Social  History Narrative   Not on file   Social Determinants of Health   Financial Resource Strain: Not on file  Food Insecurity: Not on file  Transportation Needs: Not on file  Physical Activity: Unknown (01/25/2018)   Exercise Vital Sign    Days of Exercise per Week: 0 days    Minutes of Exercise per Session: Not on file  Stress: Not on file  Social Connections: Not on file  Intimate Partner Violence: Not on file      Review of Systems  Constitutional:  Negative for activity change, chills, fatigue and unexpected weight change.  HENT:  Negative for congestion, postnasal drip, rhinorrhea, sneezing and sore throat.   Respiratory:  Negative for cough, chest tightness and shortness of breath.   Cardiovascular:  Negative for chest pain and palpitations.  Gastrointestinal:  Negative for abdominal pain, constipation, diarrhea, nausea and vomiting.  Endocrine: Negative for cold intolerance, heat intolerance, polydipsia and polyuria.  Genitourinary:  Negative for dysuria, frequency and urgency.  Musculoskeletal:  Positive for arthralgias, back pain and neck pain. Negative for joint swelling.       Foot pain due to plantar fasciitis  Skin:  Negative for rash.  Allergic/Immunologic: Negative for environmental allergies.  Neurological:  Negative for dizziness, tremors, numbness and headaches.  Hematological:  Negative for adenopathy. Does not bruise/bleed easily.  Psychiatric/Behavioral:  Negative for behavioral problems (Depression), sleep disturbance and suicidal ideas. The patient is not nervous/anxious.     Vital Signs: BP 130/88 Comment: 143/93  Pulse 87   Temp 98.3 F (36.8 C)   Resp 16   Ht '5\' 3"'$  (1.6 m)   Wt 168 lb 12.8 oz (76.6 kg)   SpO2 97%   BMI 29.90 kg/m    Physical Exam Vitals and nursing note reviewed.  Constitutional:      General: She is not in acute distress.    Appearance: Normal appearance. She is well-developed. She is not diaphoretic.  HENT:     Head:  Normocephalic and atraumatic.     Mouth/Throat:     Pharynx: No oropharyngeal exudate.  Eyes:     Pupils: Pupils are equal, round, and reactive to light.  Neck:     Thyroid: No thyromegaly.     Vascular: No JVD.     Trachea: No tracheal deviation.  Cardiovascular:     Rate and Rhythm: Normal rate and regular rhythm.     Heart sounds: Normal heart sounds. No murmur heard.    No friction rub. No gallop.  Pulmonary:     Effort: Pulmonary effort is normal. No respiratory distress.     Breath sounds: No wheezing  or rales.  Chest:     Chest wall: No tenderness.  Breasts:    Right: Normal. No mass.     Left: Normal. No mass.  Abdominal:     General: Bowel sounds are normal.     Palpations: Abdomen is soft.     Tenderness: There is no right CVA tenderness.  Musculoskeletal:        General: No tenderness. Normal range of motion.     Cervical back: Normal range of motion and neck supple.  Lymphadenopathy:     Cervical: No cervical adenopathy.  Skin:    General: Skin is warm and dry.  Neurological:     Mental Status: She is alert and oriented to person, place, and time.     Cranial Nerves: No cranial nerve deficit.  Psychiatric:        Behavior: Behavior normal.        Thought Content: Thought content normal.        Judgment: Judgment normal.        Assessment/Plan: 1. Essential hypertension Stable, continue current medications - amLODipine (NORVASC) 10 MG tablet; Take 1 tablet (10 mg total) by mouth daily.  Dispense: 90 tablet; Refill: 3 - olmesartan (BENICAR) 5 MG tablet; Take 1 tablet (5 mg total) by mouth at bedtime.  Dispense: 90 tablet; Refill: 3  2. Vitamin D deficiency - ergocalciferol (DRISDOL) 1.25 MG (50000 UT) capsule; Take one cap q week  Dispense: 12 capsule; Refill: 3  3. Musculoskeletal neck pain Will try heating pad, gentle stretching, and topical such as  icy-hot. May call for low dose '5mg'$  flexeril script if desired  4. Acute left-sided low back pain  without sciatica Will try heating pad, gentle stretching, and topical such as  icy-hot. May call for low dose '5mg'$  flexeril script if desired   General Counseling: Janiya verbalizes understanding of the findings of todays visit and agrees with plan of treatment. I have discussed any further diagnostic evaluation that may be needed or ordered today. We also reviewed her medications today. she has been encouraged to call the office with any questions or concerns that should arise related to todays visit.    No orders of the defined types were placed in this encounter.   Meds ordered this encounter  Medications   amLODipine (NORVASC) 10 MG tablet    Sig: Take 1 tablet (10 mg total) by mouth daily.    Dispense:  90 tablet    Refill:  3   meloxicam (MOBIC) 15 MG tablet    Sig: TAKE 1 TABLET(15 MG) BY MOUTH DAILY    Dispense:  30 tablet    Refill:  2   metoprolol tartrate (LOPRESSOR) 25 MG tablet    Sig: TAKE 1/2 TABLET(12.5 MG) BY MOUTH TWICE DAILY AS NEEDED    Dispense:  30 tablet    Refill:  2   olmesartan (BENICAR) 5 MG tablet    Sig: Take 1 tablet (5 mg total) by mouth at bedtime.    Dispense:  90 tablet    Refill:  3   ergocalciferol (DRISDOL) 1.25 MG (50000 UT) capsule    Sig: Take one cap q week    Dispense:  12 capsule    Refill:  3    This patient was seen by Drema Dallas, PA-C in collaboration with Dr. Clayborn Bigness as a part of collaborative care agreement.   Total time spent:30 Minutes Time spent includes review of chart, medications, test results, and follow up plan  with the patient.      Dr Lavera Guise Internal medicine

## 2022-02-27 LAB — LIPID PANEL WITH LDL/HDL RATIO
Cholesterol, Total: 211 mg/dL — ABNORMAL HIGH (ref 100–199)
HDL: 72 mg/dL (ref >39)
LDL Chol Calc (NIH): 126 mg/dL — ABNORMAL HIGH (ref 0–99)
LDL/HDL Ratio: 1.8 ratio (ref 0.0–3.2)
Triglycerides: 76 mg/dL (ref 0–149)
VLDL Cholesterol Cal: 13 mg/dL (ref 5–40)

## 2022-02-27 LAB — COMPREHENSIVE METABOLIC PANEL
ALT: 12 IU/L (ref 0–32)
AST: 15 IU/L (ref 0–40)
Albumin/Globulin Ratio: 1.6 (ref 1.2–2.2)
Albumin: 4.5 g/dL (ref 3.8–4.9)
Alkaline Phosphatase: 106 IU/L (ref 44–121)
BUN/Creatinine Ratio: 10 (ref 9–23)
BUN: 9 mg/dL (ref 6–24)
Bilirubin Total: 0.4 mg/dL (ref 0.0–1.2)
CO2: 26 mmol/L (ref 20–29)
Calcium: 9.5 mg/dL (ref 8.7–10.2)
Chloride: 101 mmol/L (ref 96–106)
Creatinine, Ser: 0.92 mg/dL (ref 0.57–1.00)
Globulin, Total: 2.8 g/dL (ref 1.5–4.5)
Glucose: 102 mg/dL — ABNORMAL HIGH (ref 70–99)
Potassium: 4.5 mmol/L (ref 3.5–5.2)
Sodium: 145 mmol/L — ABNORMAL HIGH (ref 134–144)
Total Protein: 7.3 g/dL (ref 6.0–8.5)
eGFR: 74 mL/min/{1.73_m2} (ref >59)

## 2022-02-27 LAB — TSH+FREE T4
Free T4: 1.04 ng/dL (ref 0.82–1.77)
TSH: 1.8 u[IU]/mL (ref 0.450–4.500)

## 2022-02-27 LAB — CBC WITH DIFFERENTIAL/PLATELET
Basophils Absolute: 0 10*3/uL (ref 0.0–0.2)
Basos: 0 %
EOS (ABSOLUTE): 0.1 10*3/uL (ref 0.0–0.4)
Eos: 1 %
Hematocrit: 38.4 % (ref 34.0–46.6)
Hemoglobin: 12.5 g/dL (ref 11.1–15.9)
Immature Grans (Abs): 0 10*3/uL (ref 0.0–0.1)
Immature Granulocytes: 0 %
Lymphocytes Absolute: 2.8 10*3/uL (ref 0.7–3.1)
Lymphs: 50 %
MCH: 29.2 pg (ref 26.6–33.0)
MCHC: 32.6 g/dL (ref 31.5–35.7)
MCV: 90 fL (ref 79–97)
Monocytes Absolute: 0.4 10*3/uL (ref 0.1–0.9)
Monocytes: 6 %
Neutrophils Absolute: 2.5 10*3/uL (ref 1.4–7.0)
Neutrophils: 43 %
Platelets: 294 10*3/uL (ref 150–450)
RBC: 4.28 x10E6/uL (ref 3.77–5.28)
RDW: 13.3 % (ref 11.7–15.4)
WBC: 5.8 10*3/uL (ref 3.4–10.8)

## 2022-02-27 LAB — HGB A1C W/O EAG: Hgb A1c MFr Bld: 6.3 % — ABNORMAL HIGH (ref 4.8–5.6)

## 2022-02-27 LAB — VITAMIN D 25 HYDROXY (VIT D DEFICIENCY, FRACTURES): Vit D, 25-Hydroxy: 9.8 ng/mL — ABNORMAL LOW (ref 30.0–100.0)

## 2022-03-02 ENCOUNTER — Telehealth: Payer: Self-pay

## 2022-03-02 NOTE — Telephone Encounter (Signed)
Spoke with patient regarding lab results. 

## 2022-03-02 NOTE — Telephone Encounter (Signed)
-----  Message from Mylinda Latina, PA-C sent at 03/02/2022  1:31 PM EST ----- Please let her know that her Vit D is still very low--continue drisdol, cholesterol did go up and should work on diet and exercise and avoid fried foods--may consider fish oil supplement to help. sodium very slightly elevated. A1c was 6.3 which is prediabetic range and will continue to monitor and should limit sugar intake

## 2022-03-18 ENCOUNTER — Telehealth: Payer: Self-pay | Admitting: Physician Assistant

## 2022-03-22 ENCOUNTER — Other Ambulatory Visit: Payer: Self-pay | Admitting: Physician Assistant

## 2022-03-22 DIAGNOSIS — M545 Low back pain, unspecified: Secondary | ICD-10-CM

## 2022-03-22 DIAGNOSIS — M542 Cervicalgia: Secondary | ICD-10-CM

## 2022-03-23 ENCOUNTER — Telehealth: Payer: Self-pay | Admitting: Physician Assistant

## 2022-03-23 NOTE — Telephone Encounter (Signed)
Neurology referral sent via [facility] to [facility]

## 2022-03-25 ENCOUNTER — Telehealth: Payer: Self-pay | Admitting: Physician Assistant

## 2022-03-25 NOTE — Telephone Encounter (Signed)
Neurology appointment>> 05/20/22 with KC-[name]

## 2022-03-31 NOTE — Telephone Encounter (Signed)
Error

## 2022-06-10 ENCOUNTER — Other Ambulatory Visit: Payer: Self-pay

## 2022-06-10 MED ORDER — MELOXICAM 15 MG PO TABS: ORAL_TABLET | ORAL | 2 refills | Status: DC

## 2022-06-24 ENCOUNTER — Ambulatory Visit: Payer: Managed Care, Other (non HMO) | Admitting: Physician Assistant

## 2022-06-24 ENCOUNTER — Encounter: Payer: Self-pay | Admitting: Physician Assistant

## 2022-06-24 VITALS — BP 120/80 | HR 90 | Temp 98.3°F | Resp 16 | Ht 63.0 in | Wt 174.0 lb

## 2022-06-24 DIAGNOSIS — Z23 Encounter for immunization: Secondary | ICD-10-CM

## 2022-06-24 DIAGNOSIS — I10 Essential (primary) hypertension: Secondary | ICD-10-CM | POA: Diagnosis not present

## 2022-06-24 DIAGNOSIS — Z1231 Encounter for screening mammogram for malignant neoplasm of breast: Secondary | ICD-10-CM | POA: Diagnosis not present

## 2022-06-24 MED ORDER — SHINGRIX 50 MCG/0.5ML IM SUSR: 0.5000 mL | Freq: Once | INTRAMUSCULAR | 0 refills | Status: AC

## 2022-06-24 NOTE — Progress Notes (Signed)
[facility] [address] [place], [place] [address]  Internal MEDICINE  Office Visit Note  Patient Name: Krystal Evans  [id number]  [id number]  Date of Service: 07/06/2022  Chief Complaint  Patient presents with   Follow-up   Gastroesophageal Reflux   Hypertension    HPI Pt is here for routine follow up -She is getting B12 with neurology and following for left sided neck pain and possible occipital neuralgia -Feet seem to feel better since B12 -BP stable -has not had a cycle in over a year now, hot flashes have stopped but some nipple tenderness now. No skin changes or discharge and is bilateral. Will monitor. Due for screening mammogram in July but if symptoms continue or change then could pursue diagnostic instead and she will call if so  Current Medication: Outpatient Encounter Medications as of 06/24/2022  Medication Sig   amLODipine (NORVASC) 10 MG tablet Take 1 tablet (10 mg total) by mouth daily.   ergocalciferol (DRISDOL) 1.25 MG (50000 UT) capsule Take one cap q week   meloxicam (MOBIC) 15 MG tablet TAKE 1 TABLET(15 MG) BY MOUTH DAILY   metoprolol tartrate (LOPRESSOR) 25 MG tablet TAKE 1/2 TABLET(12.5 MG) BY MOUTH TWICE DAILY AS NEEDED   olmesartan (BENICAR) 5 MG tablet Take 1 tablet (5 mg total) by mouth at bedtime.   [DISCONTINUED] Zoster Vaccine Adjuvanted [facility]) injection Inject 0.5 mLs into the muscle once.   [EXPIRED] Zoster Vaccine Adjuvanted [facility]) injection Inject 0.5 mLs into the muscle once for 1 dose.   No facility-administered encounter medications on file as of 06/24/2022.    Surgical History: Past Surgical History:  Procedure Laterality Date   CHOLECYSTECTOMY     CONTINUOUS NERVE MONITORING N/A 08/21/2019   Procedure: FACIAL NERVE MONITORING;  Surgeon: Bud Face, MD;  Location: [facility] ORS;  Service: ENT;  Laterality: N/A;   PAROTIDECTOMY Left 08/21/2019   Procedure: PAROTIDECTOMY;  Surgeon: Bud Face, MD;  Location:  [facility] ORS;  Service: ENT;  Laterality: Left;   TUBAL LIGATION      Medical History: Past Medical History:  Diagnosis Date   Abnormal uterine bleeding    Bursitis    Congestive heart failure (CHF) ([facility])    Family history of breast cancer 04/19/2012   IBIS 22.29%; 9/22 cancer genetic testing letter sent; literature given at 8/22 appt   GERD (gastroesophageal reflux disease)    Hypertension    Increased risk of breast cancer 04/19/2012   IBIS 22.29   Vitamin D deficiency     Family History: Family History  Problem Relation Age of Onset   Diabetes Mother    Hypertension Mother    Breast cancer Mother        61   Osteoporosis Mother    Glaucoma Mother    Osteoporosis Father    Heart disease Father    Hypertension Father    Hypertension Brother    Diabetes Maternal Aunt    Breast cancer Paternal Aunt 74       again at 73   Diabetes Paternal Aunt     Social History   Socioeconomic History   Marital status: Married    Spouse name: Not on file   Number of children: 2   Years of education: Not on file   Highest education level: Not on file  Occupational History   Occupation: A/R rep    Employer: [employer]  Tobacco Use   Smoking status: Never   Smokeless tobacco: Never  Vaping Use  Vaping Use: Never used  Substance and Sexual Activity   Alcohol use: No   Drug use: No   Sexual activity: Yes    Birth control/protection: Surgical  Other Topics Concern   Not on file  Social History Narrative   Not on file   Social Determinants of Health   Financial Resource Strain: Not on file  Food Insecurity: Not on file  Transportation Needs: Not on file  Physical Activity: Unknown (01/25/2018)   Exercise Vital Sign    Days of Exercise per Week: 0 days    Minutes of Exercise per Session: Not on file  Stress: Not on file  Social Connections: Not on file  Intimate Partner Violence: Not on file      Review of Systems  Constitutional:  Negative for activity change,  chills, fatigue and unexpected weight change.  HENT:  Negative for congestion, postnasal drip, rhinorrhea, sneezing and sore throat.   Respiratory:  Negative for cough, chest tightness and shortness of breath.   Cardiovascular:  Negative for chest pain and palpitations.  Gastrointestinal:  Negative for abdominal pain, constipation, diarrhea, nausea and vomiting.  Endocrine: Negative for cold intolerance, heat intolerance, polydipsia and polyuria.  Genitourinary:  Negative for dysuria, frequency and urgency.  Musculoskeletal:  Positive for arthralgias, back pain and neck pain. Negative for joint swelling.       Foot pain due to plantar fasciitis  Skin:  Negative for rash.  Allergic/Immunologic: Negative for environmental allergies.  Neurological:  Negative for dizziness, tremors, numbness and headaches.  Hematological:  Negative for adenopathy. Does not bruise/bleed easily.  Psychiatric/Behavioral:  Negative for behavioral problems (Depression), sleep disturbance and suicidal ideas. The patient is not nervous/anxious.     Vital Signs: BP 120/80   Pulse 90   Temp 98.3 F (36.8 C)   Resp 16   Ht 5\' 3"  (1.6 m)   Wt 174 lb (78.9 kg)   SpO2 98%   BMI 30.82 kg/m    Physical Exam Vitals and nursing note reviewed.  Constitutional:      General: She is not in acute distress.    Appearance: Normal appearance. She is well-developed. She is not diaphoretic.  HENT:     Head: Normocephalic and atraumatic.     Mouth/Throat:     Pharynx: No oropharyngeal exudate.  Eyes:     Pupils: Pupils are equal, round, and reactive to light.  Neck:     Thyroid: No thyromegaly.     Vascular: No JVD.     Trachea: No tracheal deviation.  Cardiovascular:     Rate and Rhythm: Normal rate and regular rhythm.     Heart sounds: Normal heart sounds. No murmur heard.    No friction rub. No gallop.  Pulmonary:     Effort: Pulmonary effort is normal. No respiratory distress.     Breath sounds: No wheezing or  rales.  Chest:     Chest wall: No tenderness.  Breasts:    Right: Normal. No mass.     Left: Normal. No mass.  Abdominal:     General: Bowel sounds are normal.     Palpations: Abdomen is soft.  Musculoskeletal:        General: No tenderness. Normal range of motion.     Cervical back: Normal range of motion and neck supple.  Lymphadenopathy:     Cervical: No cervical adenopathy.  Skin:    General: Skin is warm and dry.  Neurological:     Mental Status: She is  alert and oriented to person, place, and time.     Cranial Nerves: No cranial nerve deficit.  Psychiatric:        Behavior: Behavior normal.        Thought Content: Thought content normal.        Judgment: Judgment normal.        Assessment/Plan: 1. Essential hypertension Well controlled, continue current medication  2. Need for shingles vaccine - Zoster Vaccine Adjuvanted [facility]) injection; Inject 0.5 mLs into the muscle once for 1 dose.  Dispense: 0.5 mL; Refill: 0  3. Visit for screening mammogram Will plan on screening mammogram in July when due, however if pt has any further symptoms or changes then she will call office and will switch to diagnostic - MM 3D SCREENING MAMMOGRAM BILATERAL BREAST; Future   General Counseling: Jalyric verbalizes understanding of the findings of todays visit and agrees with plan of treatment. I have discussed any further diagnostic evaluation that may be needed or ordered today. We also reviewed her medications today. she has been encouraged to call the office with any questions or concerns that should arise related to todays visit.    Orders Placed This Encounter  Procedures   MM 3D SCREENING MAMMOGRAM BILATERAL BREAST    Meds ordered this encounter  Medications   Zoster Vaccine Adjuvanted [facility]) injection    Sig: Inject 0.5 mLs into the muscle once for 1 dose.    Dispense:  0.5 mL    Refill:  0    This patient was seen by Lynn Ito, PA-C in collaboration  with Dr. Beverely Risen as a part of collaborative care agreement.   Total time spent:30 Minutes Time spent includes review of chart, medications, test results, and follow up plan with the patient.      Dr Lyndon Code Internal medicine

## 2022-08-26 ENCOUNTER — Ambulatory Visit
Admission: RE | Admit: 2022-08-26 | Discharge: 2022-08-26 | Disposition: A | Discharge status: Home / Self Care | Payer: Managed Care, Other (non HMO) | Source: Ambulatory Visit | Attending: Physician Assistant | Admitting: Physician Assistant

## 2022-08-26 DIAGNOSIS — Z1231 Encounter for screening mammogram for malignant neoplasm of breast: Secondary | ICD-10-CM | POA: Insufficient documentation

## 2022-08-30 ENCOUNTER — Other Ambulatory Visit: Payer: Self-pay | Admitting: Physician Assistant

## 2022-08-30 DIAGNOSIS — N63 Unspecified lump in unspecified breast: Secondary | ICD-10-CM

## 2022-08-30 DIAGNOSIS — R928 Other abnormal and inconclusive findings on diagnostic imaging of breast: Secondary | ICD-10-CM

## 2022-09-03 ENCOUNTER — Ambulatory Visit
Admission: RE | Admit: 2022-09-03 | Discharge: 2022-09-03 | Disposition: A | Discharge status: Home / Self Care | Payer: Managed Care, Other (non HMO) | Source: Ambulatory Visit | Attending: Physician Assistant

## 2022-09-03 ENCOUNTER — Ambulatory Visit
Admission: RE | Admit: 2022-09-03 | Discharge: 2022-09-03 | Disposition: A | Discharge status: Home / Self Care | Payer: Managed Care, Other (non HMO) | Source: Ambulatory Visit | Attending: Physician Assistant | Admitting: Physician Assistant

## 2022-09-03 DIAGNOSIS — N63 Unspecified lump in unspecified breast: Secondary | ICD-10-CM | POA: Diagnosis present

## 2022-09-03 DIAGNOSIS — R928 Other abnormal and inconclusive findings on diagnostic imaging of breast: Secondary | ICD-10-CM | POA: Diagnosis present

## 2022-11-02 ENCOUNTER — Other Ambulatory Visit: Payer: Self-pay

## 2022-11-02 MED ORDER — MELOXICAM 15 MG PO TABS: ORAL_TABLET | ORAL | 2 refills | Status: DC

## 2022-12-23 ENCOUNTER — Telehealth: Payer: Self-pay | Admitting: Physician Assistant

## 2022-12-23 NOTE — Telephone Encounter (Signed)
Mailbox full, sent mychart message to confirm 12/30/22 appointment-[name]

## 2022-12-30 ENCOUNTER — Encounter: Payer: Managed Care, Other (non HMO) | Admitting: Physician Assistant

## 2023-01-27 ENCOUNTER — Encounter: Payer: Managed Care, Other (non HMO) | Admitting: Physician Assistant

## 2023-03-04 ENCOUNTER — Other Ambulatory Visit: Payer: Self-pay

## 2023-03-04 DIAGNOSIS — I10 Essential (primary) hypertension: Secondary | ICD-10-CM

## 2023-03-04 MED ORDER — OLMESARTAN MEDOXOMIL 5 MG PO TABS: 5.0000 mg | ORAL_TABLET | Freq: Every day | ORAL | 0 refills | Status: DC

## 2023-03-04 MED ORDER — AMLODIPINE BESYLATE 10 MG PO TABS: 10.0000 mg | ORAL_TABLET | Freq: Every day | ORAL | 0 refills | Status: DC

## 2023-03-04 MED ORDER — MELOXICAM 15 MG PO TABS: ORAL_TABLET | ORAL | 2 refills | Status: DC

## 2023-03-24 ENCOUNTER — Encounter: Payer: Self-pay | Admitting: Physician Assistant

## 2023-03-24 ENCOUNTER — Ambulatory Visit (INDEPENDENT_AMBULATORY_CARE_PROVIDER_SITE_OTHER): Payer: Managed Care, Other (non HMO) | Admitting: Physician Assistant

## 2023-03-24 VITALS — BP 112/82 | HR 70 | Temp 98.7°F | Resp 16 | Ht 63.0 in | Wt 166.4 lb

## 2023-03-24 DIAGNOSIS — Z1212 Encounter for screening for malignant neoplasm of rectum: Secondary | ICD-10-CM | POA: Diagnosis not present

## 2023-03-24 DIAGNOSIS — I10 Essential (primary) hypertension: Secondary | ICD-10-CM

## 2023-03-24 DIAGNOSIS — Z0001 Encounter for general adult medical examination with abnormal findings: Secondary | ICD-10-CM

## 2023-03-24 DIAGNOSIS — Z1211 Encounter for screening for malignant neoplasm of colon: Secondary | ICD-10-CM

## 2023-03-24 DIAGNOSIS — Z1231 Encounter for screening mammogram for malignant neoplasm of breast: Secondary | ICD-10-CM

## 2023-03-24 NOTE — Progress Notes (Signed)
[facility] [address] [place], [place] [address]  Internal MEDICINE  Office Visit Note  Patient Name: Krystal Evans  [id number]  [id number]  Date of Service: 03/30/2023  Chief Complaint  Patient presents with   Annual Exam   Hypertension   Quality Metric Gaps    Colonoscopy or cologuard      HPI Pt is here for routine health maintenance examination -stiffness in knees after inactivity, does better once moving. Does take mobic. -does have some hemorrhoids, but no other concerns. Would like to move forward with cologuard instead of colonoscopy -Shingles vaccines not done yet -mammogram due in July -due for labs  Current Medication: Outpatient Encounter Medications as of 03/24/2023  Medication Sig   amLODipine (NORVASC) 10 MG tablet Take 1 tablet (10 mg total) by mouth daily.   ergocalciferol (DRISDOL) 1.25 MG (50000 UT) capsule Take one cap q week   meloxicam (MOBIC) 15 MG tablet TAKE 1 TABLET(15 MG) BY MOUTH DAILY   metoprolol tartrate (LOPRESSOR) 25 MG tablet TAKE 1/2 TABLET(12.5 MG) BY MOUTH TWICE DAILY AS NEEDED   olmesartan (BENICAR) 5 MG tablet Take 1 tablet (5 mg total) by mouth at bedtime.   No facility-administered encounter medications on file as of 03/24/2023.    Surgical History: Past Surgical History:  Procedure Laterality Date   CHOLECYSTECTOMY     CONTINUOUS NERVE MONITORING N/A 08/21/2019   Procedure: FACIAL NERVE MONITORING;  Surgeon: Bud Face, MD;  Location: [facility];  Service: ENT;  Laterality: N/A;   PAROTIDECTOMY Left 08/21/2019   Procedure: PAROTIDECTOMY;  Surgeon: Bud Face, MD;  Location: [facility];  Service: ENT;  Laterality: Left;   TUBAL LIGATION      Medical History: Past Medical History:  Diagnosis Date   Abnormal uterine bleeding    Bursitis    Congestive heart failure (CHF) (HCC)    Family history of breast cancer 04/19/2012   IBIS 22.29%; 9/22 cancer genetic testing letter sent; literature given at  8/22 appt   GERD (gastroesophageal reflux disease)    Hypertension    Increased risk of breast cancer 04/19/2012   IBIS 22.29   Vitamin D deficiency     Family History: Family History  Problem Relation Age of Onset   Diabetes Mother    Hypertension Mother    Breast cancer Mother        44   Osteoporosis Mother    Glaucoma Mother    Osteoporosis Father    Heart disease Father    Hypertension Father    Hypertension Brother    Diabetes Maternal Aunt    Breast cancer Paternal Aunt 70       again at 42   Diabetes Paternal Aunt       Review of Systems  Constitutional:  Negative for activity change, chills, fatigue and unexpected weight change.  HENT:  Negative for congestion, postnasal drip, rhinorrhea, sneezing and sore throat.   Respiratory:  Negative for cough, chest tightness and shortness of breath.   Cardiovascular:  Negative for chest pain and palpitations.  Gastrointestinal:  Negative for abdominal pain, constipation, diarrhea, nausea and vomiting.  Endocrine: Negative for cold intolerance, heat intolerance, polydipsia and polyuria.  Genitourinary:  Negative for dysuria, frequency and urgency.  Musculoskeletal:  Positive for arthralgias. Negative for back pain, joint swelling and neck pain.  Skin:  Negative for rash.  Allergic/Immunologic: Negative for environmental allergies.  Neurological:  Negative for dizziness, tremors, numbness and headaches.  Hematological:  Negative for adenopathy. Does  not bruise/bleed easily.  Psychiatric/Behavioral:  Negative for behavioral problems (Depression), sleep disturbance and suicidal ideas. The patient is not nervous/anxious.      Vital Signs: BP 112/82   Pulse 70   Temp 98.7 F (37.1 C)   Resp 16   Ht 5\' 3"  (1.6 m)   Wt 166 lb 6.4 oz (75.5 kg)   SpO2 98%   BMI 29.48 kg/m    Physical Exam Vitals and nursing note reviewed.  Constitutional:      General: She is not in acute distress.    Appearance: Normal appearance.  She is well-developed. She is not diaphoretic.  HENT:     Head: Normocephalic and atraumatic.  Eyes:     Pupils: Pupils are equal, round, and reactive to light.  Neck:     Thyroid: No thyromegaly.     Vascular: No JVD.     Trachea: No tracheal deviation.  Cardiovascular:     Rate and Rhythm: Normal rate and regular rhythm.     Heart sounds: Normal heart sounds. No murmur heard.    No friction rub. No gallop.  Pulmonary:     Effort: Pulmonary effort is normal. No respiratory distress.  Chest:     Chest wall: No tenderness.  Breasts:    Right: Normal. No mass.     Left: Normal. No mass.  Abdominal:     General: Bowel sounds are normal.     Palpations: Abdomen is soft.     Tenderness: There is no abdominal tenderness.  Musculoskeletal:        General: No tenderness. Normal range of motion.     Cervical back: Normal range of motion and neck supple.  Lymphadenopathy:     Cervical: No cervical adenopathy.  Skin:    General: Skin is warm and dry.  Neurological:     Mental Status: She is alert and oriented to person, place, and time.  Psychiatric:        Behavior: Behavior normal.        Thought Content: Thought content normal.        Judgment: Judgment normal.      LABS: No results found for this or any previous visit (from the past 2160 hours).      Assessment/Plan: 1. Encounter for general adult medical examination with abnormal findings (Primary) CPE performed, lab slip given, Cologuard ordered, mammogram ordered, patient will also have shingles vaccines done  2. Essential hypertension Well-controlled continue current medication  3. Screening for colorectal cancer - Cologuard  4. Visit for screening mammogram - MM 3D SCREENING MAMMOGRAM BILATERAL BREAST; Future   General Counseling: Edwena verbalizes understanding of the findings of todays visit and agrees with plan of treatment. I have discussed any further diagnostic evaluation that may be needed or ordered  today. We also reviewed her medications today. she has been encouraged to call the office with any questions or concerns that should arise related to todays visit.    Counseling:    Orders Placed This Encounter  Procedures   MM 3D SCREENING MAMMOGRAM BILATERAL BREAST   Cologuard    No orders of the defined types were placed in this encounter.   This patient was seen by Lynn Ito, PA-C in collaboration with Dr. Beverely Risen as a part of collaborative care agreement.  Total time spent:35 Minutes  Time spent includes review of chart, medications, test results, and follow up plan with the patient.     Lyndon Code, MD  Internal Medicine

## 2023-04-05 ENCOUNTER — Other Ambulatory Visit: Payer: Self-pay | Admitting: Physician Assistant

## 2023-04-06 LAB — CBC WITH DIFFERENTIAL/PLATELET
Basophils Absolute: 0 10*3/uL (ref 0.0–0.2)
Basos: 1 %
EOS (ABSOLUTE): 0.1 10*3/uL (ref 0.0–0.4)
Eos: 1 %
Hematocrit: 38.1 % (ref 34.0–46.6)
Hemoglobin: 12.3 g/dL (ref 11.1–15.9)
Immature Grans (Abs): 0 10*3/uL (ref 0.0–0.1)
Immature Granulocytes: 0 %
Lymphocytes Absolute: 2.7 10*3/uL (ref 0.7–3.1)
Lymphs: 46 %
MCH: 29.4 pg (ref 26.6–33.0)
MCHC: 32.3 g/dL (ref 31.5–35.7)
MCV: 91 fL (ref 79–97)
Monocytes Absolute: 0.4 10*3/uL (ref 0.1–0.9)
Monocytes: 6 %
Neutrophils Absolute: 2.7 10*3/uL (ref 1.4–7.0)
Neutrophils: 46 %
Platelets: 278 10*3/uL (ref 150–450)
RBC: 4.18 x10E6/uL (ref 3.77–5.28)
RDW: 13.8 % (ref 11.7–15.4)
WBC: 5.8 10*3/uL (ref 3.4–10.8)

## 2023-04-06 LAB — LIPID PANEL WITH LDL/HDL RATIO
Cholesterol, Total: 236 mg/dL — ABNORMAL HIGH (ref 100–199)
HDL: 75 mg/dL (ref >39)
LDL Chol Calc (NIH): 146 mg/dL — ABNORMAL HIGH (ref 0–99)
LDL/HDL Ratio: 1.9 {ratio} (ref 0.0–3.2)
Triglycerides: 85 mg/dL (ref 0–149)
VLDL Cholesterol Cal: 15 mg/dL (ref 5–40)

## 2023-04-06 LAB — COMPREHENSIVE METABOLIC PANEL
ALT: 9 [IU]/L (ref 0–32)
AST: 13 [IU]/L (ref 0–40)
Albumin: 4.5 g/dL (ref 3.8–4.9)
Alkaline Phosphatase: 105 [IU]/L (ref 44–121)
BUN/Creatinine Ratio: 13 (ref 9–23)
BUN: 12 mg/dL (ref 6–24)
Bilirubin Total: 0.3 mg/dL (ref 0.0–1.2)
CO2: 25 mmol/L (ref 20–29)
Calcium: 9.6 mg/dL (ref 8.7–10.2)
Chloride: 105 mmol/L (ref 96–106)
Creatinine, Ser: 0.9 mg/dL (ref 0.57–1.00)
Globulin, Total: 2.6 g/dL (ref 1.5–4.5)
Glucose: 101 mg/dL — ABNORMAL HIGH (ref 70–99)
Potassium: 4.4 mmol/L (ref 3.5–5.2)
Sodium: 143 mmol/L (ref 134–144)
Total Protein: 7.1 g/dL (ref 6.0–8.5)
eGFR: 75 mL/min/{1.73_m2} (ref >59)

## 2023-04-06 LAB — TSH: TSH: 2.89 u[IU]/mL (ref 0.450–4.500)

## 2023-04-06 LAB — T4, FREE: Free T4: 1.08 ng/dL (ref 0.82–1.77)

## 2023-04-06 LAB — VITAMIN D 25 HYDROXY (VIT D DEFICIENCY, FRACTURES): Vit D, 25-Hydroxy: 15.8 ng/mL — ABNORMAL LOW (ref 30.0–100.0)

## 2023-04-06 LAB — HGB A1C W/O EAG: Hgb A1c MFr Bld: 6.4 % — ABNORMAL HIGH (ref 4.8–5.6)

## 2023-04-06 LAB — B12 AND FOLATE PANEL
Folate: 7.9 ng/mL (ref >3.0)
Vitamin B-12: 714 pg/mL (ref 232–1245)

## 2023-04-07 ENCOUNTER — Telehealth: Payer: Self-pay

## 2023-04-07 ENCOUNTER — Other Ambulatory Visit: Payer: Self-pay

## 2023-04-07 DIAGNOSIS — E559 Vitamin D deficiency, unspecified: Secondary | ICD-10-CM

## 2023-04-07 MED ORDER — ERGOCALCIFEROL 1.25 MG (50000 UT) PO CAPS: ORAL_CAPSULE | ORAL | 3 refills | Status: AC

## 2023-04-07 NOTE — Telephone Encounter (Signed)
 Patient's VM is full and sent MyChart regarding lab results.

## 2023-04-07 NOTE — Telephone Encounter (Signed)
-----   Message from Carlean Jews sent at 04/06/2023 12:40 PM EST ----- Please let her know that her cholesterol went up and I would like to add crestor 5mg  2x/week if she is open to this, A1c up some at 6.4 and continue to work on diet/exercise, vit D still low--refill drisdol

## 2023-04-13 LAB — COLOGUARD: COLOGUARD: NEGATIVE

## 2023-04-27 ENCOUNTER — Other Ambulatory Visit: Payer: Self-pay

## 2023-04-27 ENCOUNTER — Telehealth: Payer: Self-pay

## 2023-04-27 MED ORDER — ROSUVASTATIN CALCIUM 5 MG PO TABS: 5.0000 mg | ORAL_TABLET | ORAL | 3 refills | Status: AC

## 2023-04-27 NOTE — Telephone Encounter (Signed)
 Spoke with patient regarding Cologuard and labs. Patient to start 5mg  Crestor 2x weekly, per Leotis Shames.

## 2023-04-27 NOTE — Telephone Encounter (Signed)
-----   Message from Carlean Jews sent at 04/26/2023  5:05 PM EDT ----- Please let her know that her cologuard was negative

## 2023-06-07 ENCOUNTER — Other Ambulatory Visit: Payer: Self-pay

## 2023-06-07 MED ORDER — MELOXICAM 15 MG PO TABS: ORAL_TABLET | ORAL | 2 refills | Status: DC

## 2023-06-21 ENCOUNTER — Other Ambulatory Visit: Payer: Self-pay

## 2023-06-21 DIAGNOSIS — I10 Essential (primary) hypertension: Secondary | ICD-10-CM

## 2023-06-21 MED ORDER — AMLODIPINE BESYLATE 10 MG PO TABS: 10.0000 mg | ORAL_TABLET | Freq: Every day | ORAL | 0 refills | Status: DC

## 2023-06-21 MED ORDER — OLMESARTAN MEDOXOMIL 5 MG PO TABS: 5.0000 mg | ORAL_TABLET | Freq: Every day | ORAL | 0 refills | Status: DC

## 2023-07-25 ENCOUNTER — Encounter: Payer: Self-pay | Admitting: Podiatry

## 2023-07-25 ENCOUNTER — Ambulatory Visit: Admitting: Podiatry

## 2023-07-25 ENCOUNTER — Ambulatory Visit (INDEPENDENT_AMBULATORY_CARE_PROVIDER_SITE_OTHER)

## 2023-07-25 DIAGNOSIS — M722 Plantar fascial fibromatosis: Secondary | ICD-10-CM

## 2023-07-25 MED ORDER — TRIAMCINOLONE ACETONIDE 40 MG/ML IJ SUSP: 40.0000 mg | Freq: Once | INTRAMUSCULAR | Status: AC

## 2023-07-25 MED ORDER — METHYLPREDNISOLONE 4 MG PO TBPK: ORAL_TABLET | ORAL | 0 refills | Status: DC

## 2023-07-25 MED ORDER — DICLOFENAC SODIUM 75 MG PO TBEC: 75.0000 mg | DELAYED_RELEASE_TABLET | Freq: Two times a day (BID) | ORAL | 3 refills | Status: DC

## 2023-07-25 NOTE — Progress Notes (Signed)
 Subjective:  Patient ID: Krystal Evans, female    DOB: Jul 08, 1965,  MRN: [id number] HPI Chief Complaint  Patient presents with   Foot Pain    Flare of PF bilateral - continues to have pain intermittently in the heels  Plantar forefoot bilateral (R>L) - aching, shooting pains through toes, swelling, notice even knees are swollen at end of day, does take meloxicam  daily   New Patient (Initial Visit)    Est pt 03/2020    58 y.o. female presents with the above complaint.   ROS: Denies fever chills nausea mobic  muscle aches pains calf pain back pain chest pain shortness of breath.  Past Medical History:  Diagnosis Date   Abnormal uterine bleeding    Bursitis    Congestive heart failure (CHF) (HCC)    Family history of breast cancer 04/19/2012   IBIS 22.29%; 9/22 cancer genetic testing letter sent; literature given at 8/22 appt   GERD (gastroesophageal reflux disease)    Hypertension    Increased risk of breast cancer 04/19/2012   IBIS 22.29   Vitamin D  deficiency    Past Surgical History:  Procedure Laterality Date   CHOLECYSTECTOMY     CONTINUOUS NERVE MONITORING N/A 08/21/2019   Procedure: FACIAL NERVE MONITORING;  Surgeon: Rogers Clayman, MD;  Location: [facility];  Service: ENT;  Laterality: N/A;   PAROTIDECTOMY Left 08/21/2019   Procedure: Krystal Evans;  Surgeon: Rogers Clayman, MD;  Location: [facility];  Service: ENT;  Laterality: Left;   TUBAL LIGATION      Current Outpatient Medications:    diclofenac (VOLTAREN) 75 MG EC tablet, Take 1 tablet (75 mg total) by mouth 2 (two) times daily., Disp: 60 tablet, Rfl: 3   methylPREDNISolone  (MEDROL  DOSEPAK) 4 MG TBPK tablet, 6 day dose pack - take as directed, Disp: 21 tablet, Rfl: 0   amLODipine  (NORVASC ) 10 MG tablet, Take 1 tablet (10 mg total) by mouth daily., Disp: 90 tablet, Rfl: 0   ergocalciferol  (DRISDOL ) 1.25 MG (50000 UT) capsule, Take one cap q week, Disp: 12 capsule, Rfl: 3   meloxicam  (MOBIC ) 15 MG tablet, TAKE  1 TABLET(15 MG) BY MOUTH DAILY, Disp: 30 tablet, Rfl: 2   metoprolol  tartrate (LOPRESSOR ) 25 MG tablet, TAKE 1/2 TABLET(12.5 MG) BY MOUTH TWICE DAILY AS NEEDED, Disp: 30 tablet, Rfl: 2   olmesartan  (BENICAR ) 5 MG tablet, Take 1 tablet (5 mg total) by mouth at bedtime., Disp: 90 tablet, Rfl: 0   rosuvastatin  (CRESTOR ) 5 MG tablet, Take 1 tablet (5 mg total) by mouth 2 (two) times a week., Disp: 30 tablet, Rfl: 3  No Known Allergies Review of Systems Objective:  There were no vitals filed for this visit.  General: Well developed, nourished, in no acute distress, alert and oriented x3   Dermatological: Skin is warm, dry and supple bilateral. Nails x 10 are well maintained; remaining integument appears unremarkable at this time. There are no open sores, no preulcerative lesions, no rash or signs of infection present.  Vascular: Dorsalis Pedis artery and Posterior Tibial artery pedal pulses are 2/4 bilateral with immedate capillary fill time. Pedal hair growth present. No varicosities and no lower extremity edema present bilateral.   Neruologic: Grossly intact via light touch bilateral. Vibratory intact via tuning fork bilateral. Protective threshold with Semmes Wienstein monofilament intact to all pedal sites bilateral. Patellar and Achilles deep tendon reflexes 2+ bilateral. No Babinski or clonus noted bilateral.   Musculoskeletal: No gross boney pedal deformities bilateral. No pain, crepitus, or limitation noted  with foot and ankle range of motion bilateral. Muscular strength 5/5 in all groups tested bilateral.  Pain on palpation medial calcaneal tubercles bilateral.  Gait: Unassisted, Nonantalgic.    Radiographs:  Radiographs taken today demonstrate osseously mature individual though she does demonstrate a fair amount of demineralization of the bone.  Soft tissue increase in density at the plantar fascial calcaneal insertion site is indicative of plantar fasciitis.  No fractures identified.   No other acute findings noted.  Assessment & Plan:   Assessment: Plantar fasciitis bilateral resulting in forefoot symptomatology.  Plan: Discussed etiology pathology conservative versus surgical therapies.  Started her on methylprednisolone  to be followed by diclofenac.  Injected the bilateral heels today 20 mg Kenalog  5 mg of Marcaine.  I will follow-up with her in 1 month we will make sure that we reevaluate the forefoot at that time.     Demri Poulton T. [place], [place]

## 2023-08-24 ENCOUNTER — Ambulatory Visit: Admitting: Podiatry

## 2023-08-31 ENCOUNTER — Ambulatory Visit: Admitting: Podiatry

## 2023-09-05 ENCOUNTER — Ambulatory Visit: Admitting: Podiatry

## 2023-09-05 DIAGNOSIS — M722 Plantar fascial fibromatosis: Secondary | ICD-10-CM | POA: Diagnosis not present

## 2023-09-05 NOTE — Progress Notes (Signed)
 She presents today for follow-up of her plantar fasciitis bilaterally.  She states that she is improved by approximately 80%.  She states that she still has some of the cramping and burning that she had initially but it has subsided to some degree.  She states that it can happen at any time day or night.  She did not change to diclofenac  because she was concerned about stopping the meloxicam  abruptly.  She does have the prescription at home.  Objective: Vital signs are stable she is alert and oriented x 3 very little reproducible pain on palpation medial calcaneal tubercles.  She does have some tenderness overlying the lateral aspect of the right foot as opposed to the left.  Assessment: 80% reduction in plantar fasciitis pathology.  Plan: At this point I recommended that she go ahead and transition to the diclofenac .  I will follow-up with her in 1 month if necessary.

## 2023-09-22 ENCOUNTER — Ambulatory Visit (INDEPENDENT_AMBULATORY_CARE_PROVIDER_SITE_OTHER): Payer: Managed Care, Other (non HMO) | Admitting: Physician Assistant

## 2023-09-22 ENCOUNTER — Encounter: Payer: Self-pay | Admitting: Physician Assistant

## 2023-09-22 VITALS — BP 122/70 | HR 82 | Temp 98.0°F | Resp 16 | Ht 63.0 in | Wt 165.4 lb

## 2023-09-22 DIAGNOSIS — I351 Nonrheumatic aortic (valve) insufficiency: Secondary | ICD-10-CM | POA: Diagnosis not present

## 2023-09-22 DIAGNOSIS — I10 Essential (primary) hypertension: Secondary | ICD-10-CM

## 2023-09-22 DIAGNOSIS — R7303 Prediabetes: Secondary | ICD-10-CM | POA: Diagnosis not present

## 2023-09-22 LAB — POCT GLYCOSYLATED HEMOGLOBIN (HGB A1C): Hemoglobin A1C: 6.1 % — AB (ref 4.0–5.6)

## 2023-09-22 MED ORDER — AMLODIPINE BESYLATE 10 MG PO TABS: 10.0000 mg | ORAL_TABLET | Freq: Every day | ORAL | 1 refills | Status: DC

## 2023-09-22 MED ORDER — OLMESARTAN MEDOXOMIL 5 MG PO TABS: 5.0000 mg | ORAL_TABLET | Freq: Every day | ORAL | 1 refills | Status: DC

## 2023-09-22 NOTE — Progress Notes (Signed)
 [facility] [address] [place], [place] [address]  Internal MEDICINE  Office Visit Note  Patient Name: Krystal Evans  [id number]  [id number]  Date of Service: 09/22/2023  Chief Complaint  Patient presents with   Follow-up   Gastroesophageal Reflux   Hypertension   Prediabetes   Medication Refill    Amlodipine    Quality Metric Gaps    Pneumonia and Shingles vaccines    HPI Pt is here for routine follow up -not taking crestor , admits she forgot about this -BP stable -Changing diet to help bring sugars down and cholesterol  -will schedule mammogram -still needs to schedule shingles vaccine -due for echo in Dec for 3 year monitoring for trace-mild AR  Current Medication: Outpatient Encounter Medications as of 09/22/2023  Medication Sig   diclofenac  (VOLTAREN ) 75 MG EC tablet Take 1 tablet (75 mg total) by mouth 2 (two) times daily.   ergocalciferol  (DRISDOL ) 1.25 MG (50000 UT) capsule Take one cap q week   meloxicam  (MOBIC ) 15 MG tablet TAKE 1 TABLET(15 MG) BY MOUTH DAILY   metoprolol  tartrate (LOPRESSOR ) 25 MG tablet TAKE 1/2 TABLET(12.5 MG) BY MOUTH TWICE DAILY AS NEEDED   rosuvastatin  (CRESTOR ) 5 MG tablet Take 1 tablet (5 mg total) by mouth 2 (two) times a week.   [DISCONTINUED] amLODipine  (NORVASC ) 10 MG tablet Take 1 tablet (10 mg total) by mouth daily.   [DISCONTINUED] olmesartan  (BENICAR ) 5 MG tablet Take 1 tablet (5 mg total) by mouth at bedtime.   amLODipine  (NORVASC ) 10 MG tablet Take 1 tablet (10 mg total) by mouth daily.   olmesartan  (BENICAR ) 5 MG tablet Take 1 tablet (5 mg total) by mouth at bedtime.   No facility-administered encounter medications on file as of 09/22/2023.    Surgical History: Past Surgical History:  Procedure Laterality Date   CHOLECYSTECTOMY     CONTINUOUS NERVE MONITORING N/A 08/21/2019   Procedure: FACIAL NERVE MONITORING;  Surgeon: Milissa Hamming, MD;  Location: [facility];  Service: ENT;  Laterality: N/A;    PAROTIDECTOMY Left 08/21/2019   Procedure: PAROTIDECTOMY;  Surgeon: Milissa Hamming, MD;  Location: [facility];  Service: ENT;  Laterality: Left;   TUBAL LIGATION      Medical History: Past Medical History:  Diagnosis Date   Abnormal uterine bleeding    Bursitis    Congestive heart failure (CHF) (HCC)    Family history of breast cancer 04/19/2012   IBIS 22.29%; 9/22 cancer genetic testing letter sent; literature given at 8/22 appt   GERD (gastroesophageal reflux disease)    Hypertension    Increased risk of breast cancer 04/19/2012   IBIS 22.29   Vitamin D  deficiency     Family History: Family History  Problem Relation Age of Onset   Diabetes Mother    Hypertension Mother    Breast cancer Mother        62   Osteoporosis Mother    Glaucoma Mother    Osteoporosis Father    Heart disease Father    Hypertension Father    Hypertension Brother    Diabetes Maternal Aunt    Breast cancer Paternal Aunt 21       again at 17   Diabetes Paternal Aunt     Social History   Socioeconomic History   Marital status: Married    Spouse name: Not on file   Number of children: 2   Years of education: Not on file   Highest education level: Not on file  Occupational History  Occupation: A/R rep    Employer: [employer]  Tobacco Use   Smoking status: Never   Smokeless tobacco: Never  Vaping Use   Vaping status: Never Used  Substance and Sexual Activity   Alcohol use: No   Drug use: No   Sexual activity: Yes    Birth control/protection: Surgical  Other Topics Concern   Not on file  Social History Narrative   Not on file   Social Drivers of Health   Financial Resource Strain: Not on file  Food Insecurity: Not on file  Transportation Needs: Not on file  Physical Activity: Unknown (01/25/2018)   Exercise Vital Sign    Days of Exercise per Week: 0 days    Minutes of Exercise per Session: Not on file  Stress: Not on file  Social Connections: Not on file  Intimate Partner  Violence: Not on file      Review of Systems  Constitutional:  Negative for activity change, chills, fatigue and unexpected weight change.  HENT:  Negative for congestion, postnasal drip, rhinorrhea, sneezing and sore throat.   Respiratory:  Negative for cough, chest tightness and shortness of breath.   Cardiovascular:  Negative for chest pain and palpitations.  Gastrointestinal:  Negative for abdominal pain, constipation, diarrhea, nausea and vomiting.  Endocrine: Negative for cold intolerance, heat intolerance, polydipsia and polyuria.  Genitourinary:  Negative for dysuria, frequency and urgency.  Musculoskeletal:  Positive for arthralgias. Negative for back pain, joint swelling and neck pain.  Skin:  Negative for rash.  Allergic/Immunologic: Negative for environmental allergies.  Neurological:  Negative for dizziness, tremors, numbness and headaches.  Hematological:  Negative for adenopathy. Does not bruise/bleed easily.  Psychiatric/Behavioral:  Negative for behavioral problems (Depression), sleep disturbance and suicidal ideas. The patient is not nervous/anxious.     Vital Signs: BP 122/70   Pulse 82   Temp 98 F (36.7 C)   Resp 16   Ht 5' 3 (1.6 m)   Wt 165 lb 6.4 oz (75 kg)   SpO2 95%   BMI 29.30 kg/m    Physical Exam Vitals and nursing note reviewed.  Constitutional:      General: She is not in acute distress.    Appearance: Normal appearance. She is well-developed. She is not diaphoretic.  HENT:     Head: Normocephalic and atraumatic.  Eyes:     Extraocular Movements: Extraocular movements intact.  Neck:     Thyroid : No thyromegaly.     Vascular: No JVD.     Trachea: No tracheal deviation.  Cardiovascular:     Rate and Rhythm: Normal rate and regular rhythm.     Heart sounds: Normal heart sounds. No murmur heard.    No friction rub. No gallop.  Pulmonary:     Effort: Pulmonary effort is normal. No respiratory distress.  Chest:     Chest wall: No  tenderness.  Breasts:    Right: Normal. No mass.     Left: Normal. No mass.  Musculoskeletal:        General: No tenderness. Normal range of motion.  Skin:    General: Skin is warm and dry.  Neurological:     Mental Status: She is alert and oriented to person, place, and time.  Psychiatric:        Behavior: Behavior normal.        Thought Content: Thought content normal.        Judgment: Judgment normal.        Assessment/Plan: 1. Essential hypertension (  Primary) Stable, continue current medications - amLODipine  (NORVASC ) 10 MG tablet; Take 1 tablet (10 mg total) by mouth daily.  Dispense: 90 tablet; Refill: 1 - olmesartan  (BENICAR ) 5 MG tablet; Take 1 tablet (5 mg total) by mouth at bedtime.  Dispense: 90 tablet; Refill: 1  2. Prediabetes - POCT HgB A1C is 6.1 which is improved from 6.4 last check. Continue to control with diet and exercise  3. Nonrheumatic aortic valve insufficiency Will order echo for 3 year follow up as previously recommended - ECHOCARDIOGRAM COMPLETE; Future   General Counseling: Crystall verbalizes understanding of the findings of todays visit and agrees with plan of treatment. I have discussed any further diagnostic evaluation that may be needed or ordered today. We also reviewed her medications today. she has been encouraged to call the office with any questions or concerns that should arise related to todays visit.    Orders Placed This Encounter  Procedures   POCT HgB A1C   ECHOCARDIOGRAM COMPLETE    Meds ordered this encounter  Medications   amLODipine  (NORVASC ) 10 MG tablet    Sig: Take 1 tablet (10 mg total) by mouth daily.    Dispense:  90 tablet    Refill:  1   olmesartan  (BENICAR ) 5 MG tablet    Sig: Take 1 tablet (5 mg total) by mouth at bedtime.    Dispense:  90 tablet    Refill:  1    This patient was seen by Tinnie Pro, PA-C in collaboration with Dr. Sigrid Bathe as a part of collaborative care agreement.   Total time  spent:30 Minutes Time spent includes review of chart, medications, test results, and follow up plan with the patient.      Dr Fozia M Khan Internal medicine

## 2023-10-03 ENCOUNTER — Ambulatory Visit: Admitting: Podiatry

## 2023-10-18 ENCOUNTER — Ambulatory Visit
Admission: RE | Admit: 2023-10-18 | Discharge: 2023-10-18 | Disposition: A | Discharge status: Home / Self Care | Source: Ambulatory Visit | Attending: Physician Assistant | Admitting: Physician Assistant

## 2023-10-18 DIAGNOSIS — Z1231 Encounter for screening mammogram for malignant neoplasm of breast: Secondary | ICD-10-CM | POA: Diagnosis present

## 2023-11-29 ENCOUNTER — Telehealth: Payer: Self-pay

## 2023-11-30 ENCOUNTER — Other Ambulatory Visit: Payer: Self-pay | Admitting: Physician Assistant

## 2023-11-30 DIAGNOSIS — R7303 Prediabetes: Secondary | ICD-10-CM

## 2023-11-30 DIAGNOSIS — L659 Nonscarring hair loss, unspecified: Secondary | ICD-10-CM

## 2023-11-30 DIAGNOSIS — R002 Palpitations: Secondary | ICD-10-CM

## 2023-11-30 DIAGNOSIS — E559 Vitamin D deficiency, unspecified: Secondary | ICD-10-CM

## 2023-11-30 DIAGNOSIS — E538 Deficiency of other specified B group vitamins: Secondary | ICD-10-CM

## 2023-11-30 DIAGNOSIS — R5383 Other fatigue: Secondary | ICD-10-CM

## 2023-11-30 DIAGNOSIS — E782 Mixed hyperlipidemia: Secondary | ICD-10-CM

## 2023-11-30 NOTE — Telephone Encounter (Signed)
 Patient has been notified and has a apt a week after getting her labs done.

## 2023-12-03 LAB — TSH+FREE T4
Free T4: 1.13 ng/dL (ref 0.82–1.77)
TSH: 2.03 u[IU]/mL (ref 0.450–4.500)

## 2023-12-03 LAB — COMPREHENSIVE METABOLIC PANEL WITH GFR
ALT: 320 IU/L — ABNORMAL HIGH (ref 0–32)
AST: 190 IU/L — ABNORMAL HIGH (ref 0–40)
Albumin: 4.2 g/dL (ref 3.8–4.9)
Alkaline Phosphatase: 123 IU/L (ref 49–135)
BUN/Creatinine Ratio: 12 (ref 9–23)
BUN: 13 mg/dL (ref 6–24)
Bilirubin Total: 0.4 mg/dL (ref 0.0–1.2)
CO2: 22 mmol/L (ref 20–29)
Calcium: 9.5 mg/dL (ref 8.7–10.2)
Chloride: 104 mmol/L (ref 96–106)
Creatinine, Ser: 1.06 mg/dL — ABNORMAL HIGH (ref 0.57–1.00)
Globulin, Total: 3.4 g/dL (ref 1.5–4.5)
Glucose: 107 mg/dL — ABNORMAL HIGH (ref 70–99)
Potassium: 4.6 mmol/L (ref 3.5–5.2)
Sodium: 142 mmol/L (ref 134–144)
Total Protein: 7.6 g/dL (ref 6.0–8.5)
eGFR: 62 mL/min/1.73 (ref >59)

## 2023-12-03 LAB — CBC WITH DIFFERENTIAL/PLATELET
Basophils Absolute: 0 x10E3/uL (ref 0.0–0.2)
Basos: 1 %
EOS (ABSOLUTE): 0.1 x10E3/uL (ref 0.0–0.4)
Eos: 2 %
Hematocrit: 38.8 % (ref 34.0–46.6)
Hemoglobin: 12.3 g/dL (ref 11.1–15.9)
Immature Grans (Abs): 0 x10E3/uL (ref 0.0–0.1)
Immature Granulocytes: 0 %
Lymphocytes Absolute: 2.6 x10E3/uL (ref 0.7–3.1)
Lymphs: 38 %
MCH: 29.2 pg (ref 26.6–33.0)
MCHC: 31.7 g/dL (ref 31.5–35.7)
MCV: 92 fL (ref 79–97)
Monocytes Absolute: 0.4 x10E3/uL (ref 0.1–0.9)
Monocytes: 6 %
Neutrophils Absolute: 3.6 x10E3/uL (ref 1.4–7.0)
Neutrophils: 53 %
Platelets: 330 x10E3/uL (ref 150–450)
RBC: 4.21 x10E6/uL (ref 3.77–5.28)
RDW: 13.2 % (ref 11.7–15.4)
WBC: 6.9 x10E3/uL (ref 3.4–10.8)

## 2023-12-03 LAB — LIPID PANEL WITH LDL/HDL RATIO
Cholesterol, Total: 232 mg/dL — ABNORMAL HIGH (ref 100–199)
HDL: 63 mg/dL (ref >39)
LDL Chol Calc (NIH): 152 mg/dL — ABNORMAL HIGH (ref 0–99)
LDL/HDL Ratio: 2.4 ratio (ref 0.0–3.2)
Triglycerides: 97 mg/dL (ref 0–149)
VLDL Cholesterol Cal: 17 mg/dL (ref 5–40)

## 2023-12-03 LAB — ESTRADIOL: Estradiol: 10.9 pg/mL

## 2023-12-03 LAB — IRON,TIBC AND FERRITIN PANEL
Ferritin: 523 ng/mL — ABNORMAL HIGH (ref 15–150)
Iron Saturation: 23 % (ref 15–55)
Iron: 65 ug/dL (ref 27–159)
Total Iron Binding Capacity: 286 ug/dL (ref 250–450)
UIBC: 221 ug/dL (ref 131–425)

## 2023-12-03 LAB — VITAMIN D 25 HYDROXY (VIT D DEFICIENCY, FRACTURES): Vit D, 25-Hydroxy: 13.6 ng/mL — ABNORMAL LOW (ref 30.0–100.0)

## 2023-12-03 LAB — FSH/LH
FSH: 82.6 m[IU]/mL
LH: 45 m[IU]/mL

## 2023-12-03 LAB — HGB A1C W/O EAG: Hgb A1c MFr Bld: 6 % — ABNORMAL HIGH (ref 4.8–5.6)

## 2023-12-03 LAB — B12 AND FOLATE PANEL
Folate: 10.8 ng/mL (ref >3.0)
Vitamin B-12: 1335 pg/mL — ABNORMAL HIGH (ref 232–1245)

## 2023-12-05 ENCOUNTER — Ambulatory Visit: Payer: Self-pay | Admitting: Physician Assistant

## 2023-12-06 ENCOUNTER — Telehealth: Payer: Self-pay | Admitting: Physician Assistant

## 2023-12-06 NOTE — Telephone Encounter (Signed)
 Received order from [facility]. Gave to Lauren to review and sign-[name]

## 2023-12-08 ENCOUNTER — Telehealth: Payer: Self-pay | Admitting: Physician Assistant

## 2023-12-08 NOTE — Telephone Encounter (Signed)
 Lab report signed. Faxed back to [facility]; [phone]. Scanned-[name]

## 2023-12-12 ENCOUNTER — Ambulatory Visit (INDEPENDENT_AMBULATORY_CARE_PROVIDER_SITE_OTHER): Admitting: Physician Assistant

## 2023-12-12 ENCOUNTER — Encounter: Payer: Self-pay | Admitting: Physician Assistant

## 2023-12-12 VITALS — BP 130/70 | HR 94 | Temp 98.0°F | Resp 16 | Ht 63.0 in | Wt 168.4 lb

## 2023-12-12 DIAGNOSIS — L659 Nonscarring hair loss, unspecified: Secondary | ICD-10-CM

## 2023-12-12 DIAGNOSIS — I351 Nonrheumatic aortic (valve) insufficiency: Secondary | ICD-10-CM

## 2023-12-12 DIAGNOSIS — R7989 Other specified abnormal findings of blood chemistry: Secondary | ICD-10-CM | POA: Diagnosis not present

## 2023-12-12 DIAGNOSIS — E782 Mixed hyperlipidemia: Secondary | ICD-10-CM

## 2023-12-12 DIAGNOSIS — R002 Palpitations: Secondary | ICD-10-CM | POA: Diagnosis not present

## 2023-12-12 DIAGNOSIS — E559 Vitamin D deficiency, unspecified: Secondary | ICD-10-CM

## 2023-12-12 DIAGNOSIS — I10 Essential (primary) hypertension: Secondary | ICD-10-CM | POA: Diagnosis not present

## 2023-12-12 DIAGNOSIS — R7303 Prediabetes: Secondary | ICD-10-CM

## 2023-12-12 MED ORDER — BISOPROLOL FUMARATE 5 MG PO TABS: 5.0000 mg | ORAL_TABLET | Freq: Every day | ORAL | 2 refills | Status: DC

## 2023-12-12 NOTE — Progress Notes (Unsigned)
 [facility] [address] [place], [place] [address]  Internal MEDICINE  Office Visit Note  Patient Name: Krystal Evans  [id number]  [id number]  Date of Service: 12/13/2023  Chief Complaint  Patient presents with   Follow-up    Review labs   Gastroesophageal Reflux   Hypertension    HPI Pt is here for routine follow up -more palpitations recently, taking metoprolol  everyday which helps, but then has to take again the next day. She did not take it today since she had appt to address this. Has been taking 12.5 mg BID recently. -S/E with med since taking daily is dull headache, a little phlegm cough, hair shedding. Previously only took this as needed for an occasional palpitations.  -she does have echo already scheduled next month for a 21yr follow up to monitor trace AR, TR, PR -Labs reviewed: creatinine a little elevated, A1c still prediabetic range, LFTs very elevated, ferritin elevated -no cycle over a year, some hot flashes, night sweats, labs show postmenopausal -Vit D still low and takes drisdol  sometimes. Forgets after a little while. B12 a little high and can reduce supplement -cholesterol rising not taking crestor  recently, but advised to hold for now due to LFTs -no alcohol use, takes tylenol  as needed for headache but not much -denies any nausea or abdominal pain, she had cholecystectomy in the past -Denies feeling more anxious up until labs discussed  Current Medication: Outpatient Encounter Medications as of 12/12/2023  Medication Sig   amLODipine  (NORVASC ) 10 MG tablet Take 1 tablet (10 mg total) by mouth daily.   bisoprolol (ZEBETA) 5 MG tablet Take 1 tablet (5 mg total) by mouth daily.   diclofenac  (VOLTAREN ) 75 MG EC tablet Take 1 tablet (75 mg total) by mouth 2 (two) times daily.   ergocalciferol  (DRISDOL ) 1.25 MG (50000 UT) capsule Take one cap q week   olmesartan  (BENICAR ) 5 MG tablet Take 1 tablet (5 mg total) by mouth at bedtime.   [DISCONTINUED]  meloxicam  (MOBIC ) 15 MG tablet TAKE 1 TABLET(15 MG) BY MOUTH DAILY   [DISCONTINUED] metoprolol  tartrate (LOPRESSOR ) 25 MG tablet TAKE 1/2 TABLET(12.5 MG) BY MOUTH TWICE DAILY AS NEEDED   rosuvastatin  (CRESTOR ) 5 MG tablet Take 1 tablet (5 mg total) by mouth 2 (two) times a week. (Patient not taking: Reported on 12/13/2023)   No facility-administered encounter medications on file as of 12/12/2023.    Surgical History: Past Surgical History:  Procedure Laterality Date   CHOLECYSTECTOMY     CONTINUOUS NERVE MONITORING N/A 08/21/2019   Procedure: FACIAL NERVE MONITORING;  Surgeon: Milissa Hamming, MD;  Location: [facility] ORS;  Service: ENT;  Laterality: N/A;   PAROTIDECTOMY Left 08/21/2019   Procedure: PAROTIDECTOMY;  Surgeon: Milissa Hamming, MD;  Location: [facility] OR[facility];  Service: ENT;  Laterality: Left;   TUBAL LIGATION      Medical History: Past Medical History:  Diagnosis Date   Abnormal uterine bleeding    Bursitis    Congestive heart failure (CHF) (HCC)    Family history of breast cancer 04/19/2012   IBIS 22.29%; 9/22 cancer genetic testing letter sent; literature given at 8/22 appt   GERD (gastroesophageal reflux disease)    Hypertension    Increased risk of breast cancer 04/19/2012   IBIS 22.29   Vitamin D  deficiency     Family History: Family History  Problem Relation Age of Onset   Diabetes Mother    Hypertension Mother    Breast cancer Mother  23   Osteoporosis Mother    Glaucoma Mother    Osteoporosis Father    Heart disease Father    Hypertension Father    Hypertension Brother    Diabetes Maternal Aunt    Breast cancer Paternal Aunt 41       again at 91   Diabetes Paternal Aunt     Social History   Socioeconomic History   Marital status: Married    Spouse name: Not on file   Number of children: 2   Years of education: Not on file   Highest education level: Not on file  Occupational History   Occupation: A/R rep    Employer: [employer]  Tobacco Use    Smoking status: Never   Smokeless tobacco: Never  Vaping Use   Vaping status: Never Used  Substance and Sexual Activity   Alcohol use: No   Drug use: No   Sexual activity: Yes    Birth control/protection: Surgical  Other Topics Concern   Not on file  Social History Narrative   Not on file   Social Drivers of Health   Financial Resource Strain: Not on file  Food Insecurity: Not on file  Transportation Needs: Not on file  Physical Activity: Unknown (01/25/2018)   Exercise Vital Sign    Days of Exercise per Week: 0 days    Minutes of Exercise per Session: Not on file  Stress: Not on file  Social Connections: Not on file  Intimate Partner Violence: Not on file      Review of Systems  Constitutional:  Negative for activity change, chills, fatigue and unexpected weight change.  HENT:  Negative for congestion, postnasal drip, rhinorrhea, sneezing and sore throat.   Respiratory:  Negative for cough, chest tightness and shortness of breath.   Cardiovascular:  Positive for palpitations. Negative for chest pain.  Gastrointestinal:  Negative for abdominal pain, constipation, diarrhea, nausea and vomiting.  Endocrine: Negative for cold intolerance, heat intolerance, polydipsia and polyuria.  Genitourinary:  Negative for dysuria, frequency and urgency.  Musculoskeletal:  Positive for arthralgias. Negative for back pain, joint swelling and neck pain.  Skin:  Negative for rash.  Allergic/Immunologic: Negative for environmental allergies.  Neurological:  Negative for dizziness, tremors, numbness and headaches.  Hematological:  Negative for adenopathy. Does not bruise/bleed easily.  Psychiatric/Behavioral:  Negative for behavioral problems (Depression), sleep disturbance and suicidal ideas. The patient is not nervous/anxious.     Vital Signs: BP 130/70   Pulse 94   Temp 98 F (36.7 C)   Resp 16   Ht 5' 3 (1.6 m)   Wt 168 lb 6.4 oz (76.4 kg)   SpO2 99%   BMI 29.83 kg/m     Physical Exam Vitals and nursing note reviewed.  Constitutional:      General: She is not in acute distress.    Appearance: Normal appearance. She is well-developed. She is not diaphoretic.  HENT:     Head: Normocephalic and atraumatic.  Eyes:     Extraocular Movements: Extraocular movements intact.  Neck:     Thyroid : No thyromegaly.     Vascular: No JVD.     Trachea: No tracheal deviation.  Cardiovascular:     Rate and Rhythm: Normal rate and regular rhythm.     Heart sounds: Normal heart sounds. No murmur heard.    No friction rub. No gallop.  Pulmonary:     Effort: Pulmonary effort is normal. No respiratory distress.  Chest:     Chest  wall: No tenderness.  Breasts:    Right: Normal. No mass.     Left: Normal. No mass.  Abdominal:     Tenderness: There is no abdominal tenderness. There is no guarding.  Skin:    General: Skin is warm and dry.  Neurological:     Mental Status: She is alert and oriented to person, place, and time.  Psychiatric:        Behavior: Behavior normal.        Thought Content: Thought content normal.        Judgment: Judgment normal.        Assessment/Plan: 1. Palpitations (Primary) Will stop metoprolol  and switch to bisoprolol due to pt experiencing some S/E with daily use. Will also order long term heart monitor for further evaluation. May need to hold olmesartan  if BP low with addition of bisoprolol and pt will monitor. Call if any concerns. Echo also already scheduled - EKG 12-Lead - LONG TERM MONITOR XT (3-14 DAYS); Future - bisoprolol (ZEBETA) 5 MG tablet; Take 1 tablet (5 mg total) by mouth daily.  Dispense: 30 tablet; Refill: 2  2. Essential hypertension Stable, but with addition of bisoprolol instead of prn metoprolol  may need to hold olmesartan  if BP dropping. Continue amlodipine . Call if concerns  3. Elevated LFTs Very elevated LFTs without clear cause or symptoms. Will pursue further workup for possible autoimmune hepatitis  and check Liver US . Call if any symptoms arise. Will hold off on restarting statin for now - Hepatic function panel - ANA w/Reflex if Positive - Sed Rate (ESR) - Anti-smooth muscle antibody, IgG - Lactate dehydrogenase - AntiMicrosomal Ab-Liver / Kidney - Gamma GT - Mitochondrial antibodies - US  Abdomen Limited RUQ (LIVER/GB); Future  4. Nonrheumatic aortic valve insufficiency Echo already scheduled  5. Hair thinning - ANA w/Reflex if Positive - Sed Rate (ESR)  6. Prediabetes Stable, continue to monitor  7. Vitamin D  deficiency Restart drisdol   8. Mixed hyperlipidemia Hold statin due to LFTs for now and work on diet for now   General Counseling: Alexsia verbalizes understanding of the findings of todays visit and agrees with plan of treatment. I have discussed any further diagnostic evaluation that may be needed or ordered today. We also reviewed her medications today. she has been encouraged to call the office with any questions or concerns that should arise related to todays visit.    Orders Placed This Encounter  Procedures   US  Abdomen Limited RUQ (LIVER/GB)   Hepatic function panel   ANA w/Reflex if Positive   Sed Rate (ESR)   Anti-smooth muscle antibody, IgG   Lactate dehydrogenase   AntiMicrosomal Ab-Liver / Kidney   Gamma GT   Mitochondrial antibodies   LONG TERM MONITOR XT (3-14 DAYS)   EKG 12-Lead    Meds ordered this encounter  Medications   bisoprolol (ZEBETA) 5 MG tablet    Sig: Take 1 tablet (5 mg total) by mouth daily.    Dispense:  30 tablet    Refill:  2    This patient was seen by Tinnie Pro, PA-C in collaboration with Dr. Sigrid Bathe as a part of collaborative care agreement.   Total time spent:50 Minutes Time spent includes review of chart, medications, test results, and follow up plan with the patient.      Dr Fozia M Khan Internal medicine

## 2023-12-13 ENCOUNTER — Telehealth: Payer: Self-pay | Admitting: Physician Assistant

## 2023-12-13 ENCOUNTER — Ambulatory Visit: Attending: Physician Assistant

## 2023-12-13 DIAGNOSIS — R002 Palpitations: Secondary | ICD-10-CM

## 2023-12-13 NOTE — Telephone Encounter (Signed)
 Notified patient of U/S appointment date, arrival time, location and npo 6 hrs priorToni

## 2023-12-16 LAB — ANA W/REFLEX IF POSITIVE
Anti JO-1: <0.2 AI (ref 0.0–0.9)
Anti Nuclear Antibody (ANA): POSITIVE — AB
Centromere Ab Screen: <0.2 AI (ref 0.0–0.9)
Chromatin Ab SerPl-aCnc: <0.2 AI (ref 0.0–0.9)
ENA RNP Ab: 6.5 AI — ABNORMAL HIGH (ref 0.0–0.9)
ENA SM Ab Ser-aCnc: <0.2 AI (ref 0.0–0.9)
ENA SSA (RO) Ab: <0.2 AI (ref 0.0–0.9)
ENA SSB (LA) Ab: <0.2 AI (ref 0.0–0.9)
Scleroderma (Scl-70) (ENA) Antibody, IgG: <0.2 AI (ref 0.0–0.9)
dsDNA Ab: <1 [IU]/mL (ref 0–9)

## 2023-12-16 LAB — HEPATIC FUNCTION PANEL
ALT: 249 IU/L — ABNORMAL HIGH (ref 0–32)
AST: 113 IU/L — ABNORMAL HIGH (ref 0–40)
Albumin: 4.4 g/dL (ref 3.8–4.9)
Alkaline Phosphatase: 114 IU/L (ref 49–135)
Bilirubin Total: 0.4 mg/dL (ref 0.0–1.2)
Bilirubin, Direct: 0.14 mg/dL (ref 0.00–0.40)
Total Protein: 7.3 g/dL (ref 6.0–8.5)

## 2023-12-16 LAB — GAMMA GT: GGT: 47 IU/L (ref 0–60)

## 2023-12-16 LAB — ANTI-MICROSOMAL ANTIBODY LIVER / KIDNEY: LKM1 Ab: 0.6 U (ref 0.0–20.0)

## 2023-12-16 LAB — MITOCHONDRIAL ANTIBODIES: Mitochondrial Ab: <20 U (ref 0.0–20.0)

## 2023-12-16 LAB — LACTATE DEHYDROGENASE: LDH: 317 IU/L — ABNORMAL HIGH (ref 119–226)

## 2023-12-16 LAB — SEDIMENTATION RATE: Sed Rate: 14 mm/h (ref 0–40)

## 2023-12-16 LAB — ANTI-SMOOTH MUSCLE ANTIBODY, IGG: Smooth Muscle Ab: 7 U (ref 0–19)

## 2023-12-19 ENCOUNTER — Ambulatory Visit
Admission: RE | Admit: 2023-12-19 | Discharge: 2023-12-19 | Disposition: A | Discharge status: Home / Self Care | Source: Ambulatory Visit | Attending: Physician Assistant | Admitting: Physician Assistant

## 2023-12-19 DIAGNOSIS — R7989 Other specified abnormal findings of blood chemistry: Secondary | ICD-10-CM | POA: Diagnosis present

## 2023-12-20 LAB — SPECIMEN STATUS REPORT

## 2023-12-20 LAB — HBSAG QUANTITATIVE, MONITOR

## 2023-12-20 LAB — HCV AB W REFLEX TO QUANT PCR: HCV Ab: NONREACTIVE

## 2023-12-20 LAB — HCV INTERPRETATION

## 2024-01-02 ENCOUNTER — Encounter: Payer: Self-pay | Admitting: Physician Assistant

## 2024-01-02 ENCOUNTER — Ambulatory Visit: Admitting: Physician Assistant

## 2024-01-02 VITALS — BP 115/75 | HR 76 | Temp 98.0°F | Resp 16 | Ht 63.0 in | Wt 167.0 lb

## 2024-01-02 DIAGNOSIS — M2550 Pain in unspecified joint: Secondary | ICD-10-CM | POA: Diagnosis not present

## 2024-01-02 DIAGNOSIS — R7689 Other specified abnormal immunological findings in serum: Secondary | ICD-10-CM

## 2024-01-02 DIAGNOSIS — I10 Essential (primary) hypertension: Secondary | ICD-10-CM

## 2024-01-02 DIAGNOSIS — R7989 Other specified abnormal findings of blood chemistry: Secondary | ICD-10-CM | POA: Diagnosis not present

## 2024-01-02 DIAGNOSIS — R002 Palpitations: Secondary | ICD-10-CM

## 2024-01-02 NOTE — Progress Notes (Signed)
 [facility] [address] [place], [place] [address]  Internal MEDICINE  Office Visit Note  Patient Name: Krystal Evans  [id number]  [id number]  Date of Service: 01/02/2024  Chief Complaint  Patient presents with   Follow-up   Hypertension   Gastroesophageal Reflux   Medication Refill    Amlodipine  and Olmesartan     HPI Pt is here for routine follow up to review results -US  reviewed and did not show any etiology for elevated LFTs -labs reviewed: LFTS still high but improving some, elevated LDH, and ANA positive---does have joint pains/aches. GM had RA -still no abdominal pain -still having hair shedding, but no more headaches. States she doesn't think Bisoprolol  works as well as metoprolol  for the palpitations, but no S/E -awaiting holter monitor results still -still holding olmesartan  and BP doing well so will d/c  Current Medication: Outpatient Encounter Medications as of 01/02/2024  Medication Sig   amLODipine  (NORVASC ) 10 MG tablet Take 1 tablet (10 mg total) by mouth daily.   bisoprolol  (ZEBETA ) 5 MG tablet Take 1 tablet (5 mg total) by mouth daily.   diclofenac  (VOLTAREN ) 75 MG EC tablet Take 1 tablet (75 mg total) by mouth 2 (two) times daily.   ergocalciferol  (DRISDOL ) 1.25 MG (50000 UT) capsule Take one cap q week   rosuvastatin  (CRESTOR ) 5 MG tablet Take 1 tablet (5 mg total) by mouth 2 (two) times a week.   [DISCONTINUED] olmesartan  (BENICAR ) 5 MG tablet Take 1 tablet (5 mg total) by mouth at bedtime.   No facility-administered encounter medications on file as of 01/02/2024.    Surgical History: Past Surgical History:  Procedure Laterality Date   CHOLECYSTECTOMY     CONTINUOUS NERVE MONITORING N/A 08/21/2019   Procedure: FACIAL NERVE MONITORING;  Surgeon: Milissa Hamming, MD;  Location: [facility];  Service: ENT;  Laterality: N/A;   PAROTIDECTOMY Left 08/21/2019   Procedure: PAROTIDECTOMY;  Surgeon: Milissa Hamming, MD;  Location: [facility];   Service: ENT;  Laterality: Left;   TUBAL LIGATION      Medical History: Past Medical History:  Diagnosis Date   Abnormal uterine bleeding    Bursitis    Congestive heart failure (CHF) (HCC)    Family history of breast cancer 04/19/2012   IBIS 22.29%; 9/22 cancer genetic testing letter sent; literature given at 8/22 appt   GERD (gastroesophageal reflux disease)    Hypertension    Increased risk of breast cancer 04/19/2012   IBIS 22.29   Vitamin D  deficiency     Family History: Family History  Problem Relation Age of Onset   Diabetes Mother    Hypertension Mother    Breast cancer Mother        41   Osteoporosis Mother    Glaucoma Mother    Osteoporosis Father    Heart disease Father    Hypertension Father    Hypertension Brother    Diabetes Maternal Aunt    Breast cancer Paternal Aunt 79       again at 30   Diabetes Paternal Aunt     Social History   Socioeconomic History   Marital status: Married    Spouse name: Not on file   Number of children: 2   Years of education: Not on file   Highest education level: Not on file  Occupational History   Occupation: A/R rep    Employer: [employer]  Tobacco Use   Smoking status: Never   Smokeless tobacco: Never  Vaping Use  Vaping status: Never Used  Substance and Sexual Activity   Alcohol use: No   Drug use: No   Sexual activity: Yes    Birth control/protection: Surgical  Other Topics Concern   Not on file  Social History Narrative   Not on file   Social Drivers of Health   Financial Resource Strain: Not on file  Food Insecurity: Not on file  Transportation Needs: Not on file  Physical Activity: Unknown (01/25/2018)   Exercise Vital Sign    Days of Exercise per Week: 0 days    Minutes of Exercise per Session: Not on file  Stress: Not on file  Social Connections: Not on file  Intimate Partner Violence: Not on file      Review of Systems  Constitutional:  Negative for activity change, chills, fatigue  and unexpected weight change.  HENT:  Negative for congestion, postnasal drip, rhinorrhea, sneezing and sore throat.   Respiratory:  Negative for cough, chest tightness and shortness of breath.   Cardiovascular:  Positive for palpitations. Negative for chest pain.  Gastrointestinal:  Negative for abdominal pain, constipation, diarrhea, nausea and vomiting.  Endocrine: Negative for cold intolerance, heat intolerance, polydipsia and polyuria.  Genitourinary:  Negative for dysuria, frequency and urgency.  Musculoskeletal:  Positive for arthralgias. Negative for back pain, joint swelling and neck pain.  Skin:  Negative for rash.  Allergic/Immunologic: Negative for environmental allergies.  Neurological:  Negative for dizziness, tremors, numbness and headaches.  Hematological:  Negative for adenopathy. Does not bruise/bleed easily.  Psychiatric/Behavioral:  Negative for behavioral problems (Depression), sleep disturbance and suicidal ideas. The patient is not nervous/anxious.     Vital Signs: BP 115/75   Pulse 76   Temp 98 F (36.7 C)   Resp 16   Ht 5' 3 (1.6 m)   Wt 167 lb (75.8 kg)   SpO2 98%   BMI 29.58 kg/m    Physical Exam Vitals and nursing note reviewed.  Constitutional:      General: She is not in acute distress.    Appearance: Normal appearance. She is well-developed. She is not diaphoretic.  HENT:     Head: Normocephalic and atraumatic.  Eyes:     Extraocular Movements: Extraocular movements intact.  Neck:     Thyroid : No thyromegaly.     Vascular: No JVD.     Trachea: No tracheal deviation.  Cardiovascular:     Rate and Rhythm: Normal rate and regular rhythm.     Heart sounds: Normal heart sounds. No murmur heard.    No friction rub. No gallop.  Pulmonary:     Effort: Pulmonary effort is normal. No respiratory distress.  Chest:     Chest wall: No tenderness.  Breasts:    Right: Normal. No mass.     Left: Normal. No mass.  Abdominal:     Tenderness: There is  no abdominal tenderness. There is no guarding.  Skin:    General: Skin is warm and dry.  Neurological:     Mental Status: She is alert and oriented to person, place, and time.  Psychiatric:        Behavior: Behavior normal.        Thought Content: Thought content normal.        Judgment: Judgment normal.        Assessment/Plan: 1. Elevated LFTs (Primary) Will recheck levels as these were starting to decrease. Less concern for autoimmune hepatitis given other labs, but will refer to rheumatology given elevated ANA and  symptomatic arthralgias.  - CMP14+EGFR - Ambulatory referral to Rheumatology  2. Positive ANA (antinuclear antibody) - Ambulatory referral to Rheumatology  3. Polyarthralgia - Ambulatory referral to Rheumatology  4. Palpitations Still having some, but improved. Continue bisoprolol  while awaiting heart monitor results  5. Essential hypertension Continue amlodipine  and bisoprolol  and will officially d/c olmesartan  since BP stable off of this.   General Counseling: farheen pfahler understanding of the findings of todays visit and agrees with plan of treatment. I have discussed any further diagnostic evaluation that may be needed or ordered today. We also reviewed her medications today. she has been encouraged to call the office with any questions or concerns that should arise related to todays visit.    Orders Placed This Encounter  Procedures   CMP14+EGFR   Ambulatory referral to Rheumatology    No orders of the defined types were placed in this encounter.   This patient was seen by Tinnie Pro, PA-C in collaboration with Dr. Sigrid Bathe as a part of collaborative care agreement.   Total time spent:30 Minutes Time spent includes review of chart, medications, test results, and follow up plan with the patient.      Dr Fozia M Khan Internal medicine

## 2024-01-03 ENCOUNTER — Telehealth: Payer: Self-pay | Admitting: Physician Assistant

## 2024-01-03 NOTE — Telephone Encounter (Signed)
 Awaiting 01/02/24 office notes for Rheumatology referral-[name]

## 2024-01-05 DIAGNOSIS — R002 Palpitations: Secondary | ICD-10-CM | POA: Diagnosis not present

## 2024-01-05 LAB — CMP14+EGFR
ALT: 50 IU/L — ABNORMAL HIGH (ref 0–32)
AST: 34 IU/L (ref 0–40)
Albumin: 4.4 g/dL (ref 3.8–4.9)
Alkaline Phosphatase: 104 IU/L (ref 49–135)
BUN/Creatinine Ratio: 12 (ref 9–23)
BUN: 12 mg/dL (ref 6–24)
Bilirubin Total: 0.4 mg/dL (ref 0.0–1.2)
CO2: 25 mmol/L (ref 20–29)
Calcium: 9.5 mg/dL (ref 8.7–10.2)
Chloride: 102 mmol/L (ref 96–106)
Creatinine, Ser: 1.02 mg/dL — ABNORMAL HIGH (ref 0.57–1.00)
Globulin, Total: 3 g/dL (ref 1.5–4.5)
Glucose: 87 mg/dL (ref 70–99)
Potassium: 4.2 mmol/L (ref 3.5–5.2)
Sodium: 143 mmol/L (ref 134–144)
Total Protein: 7.4 g/dL (ref 6.0–8.5)
eGFR: 64 mL/min/1.73 (ref >59)

## 2024-01-12 ENCOUNTER — Telehealth: Payer: Self-pay | Admitting: Physician Assistant

## 2024-01-12 ENCOUNTER — Ambulatory Visit: Attending: Physician Assistant

## 2024-01-12 NOTE — Telephone Encounter (Signed)
 Rheumatology referral sent via [facility] to [facility] Notified patient. Gave patient telephone # [phone]

## 2024-01-27 ENCOUNTER — Ambulatory Visit: Payer: Self-pay | Admitting: Physician Assistant

## 2024-01-30 NOTE — Telephone Encounter (Signed)
 Spoke with patient regarding results and cardiology referral.

## 2024-01-30 NOTE — Telephone Encounter (Signed)
-----   Message from Tinnie MARLA Pro sent at 01/27/2024  5:53 PM EST ----- Please let her know that heart monitor did show SVT, an abnormal rhythm where the heart suddenly beats fast. I am referring to cardiology. It looks like she missed her echo, but cardiology can check  this. Liver enzymes have continued to improve and almost normal now! She does not need appt with [facility] on 23rd unless she has additional qs and wants to keep this.

## 2024-01-31 ENCOUNTER — Ambulatory Visit: Admitting: Internal Medicine

## 2024-01-31 ENCOUNTER — Other Ambulatory Visit: Payer: Self-pay | Admitting: Physician Assistant

## 2024-01-31 DIAGNOSIS — R002 Palpitations: Secondary | ICD-10-CM

## 2024-01-31 DIAGNOSIS — I351 Nonrheumatic aortic (valve) insufficiency: Secondary | ICD-10-CM

## 2024-01-31 DIAGNOSIS — I471 Supraventricular tachycardia, unspecified: Secondary | ICD-10-CM

## 2024-02-12 ENCOUNTER — Other Ambulatory Visit: Payer: Self-pay | Admitting: Podiatry

## 2024-02-12 NOTE — Progress Notes (Unsigned)
 Cardiology Office Note  Date:  02/13/2024   ID:  ABIE Evans, DOB 01/02/1966, MRN [id number]  PCP:  Kristina Tinnie POUR, PA-C   Chief Complaint  Patient presents with   New Patient (Initial Visit)    Referral by Tinnie Kristina, PA-C for SVT, palpitations, aortic valve disorder.  Patient has felt great since the new medications prescribed by her PCP-PA.    HPI:  Krystal Evans is a 59 y.o. female with past medical history of: Past Medical History:  Diagnosis Date   Abnormal uterine bleeding    Bursitis    Congestive heart failure (CHF) (HCC)    Family history of breast cancer 04/19/2012   IBIS 22.29%; 9/22 cancer genetic testing letter sent; literature given at 8/22 appt   GERD (gastroesophageal reflux disease)    Hypertension    Increased risk of breast cancer 04/19/2012   IBIS 22.29   Vitamin D  deficiency   Who presents by referral from Tinnie Kristina for consultation of her SVT, palpitations/PVCs, aortic valve disorder  On discussion today she reports having prior symptoms of tachycardia and palpitations Previously treated with metoprolol  though reports this became less effective and she developed worsening symptoms  Zio monitor ordered November 2025 HR 56 - 137, average 72 bpm. 7 nonsustained SVT (longest 14.6 seconds). No atrial fibrillation detected. Rare supraventricular ectopy. Occasional ventricular ectopy, 3.5%. No sustained arrhythmias. Symptom trigger episodes correspond to sinus rhythm with occasional PVCs.  Results pulled up and reviewed with her on today's visit  She was started on bisoprolol  5 mg daily which she feels has helped her symptoms  Denies any recent stressors that may have contributed to her arrhythmia  EKG personally reviewed by myself on todays visit EKG Interpretation Date/Time:  Monday February 13 2024 15:56:01 EST Ventricular Rate:  64 PR Interval:  176 QRS Duration:  74 QT Interval:  412 QTC Calculation: 425 R Axis:   10  Text  Interpretation: Normal sinus rhythm Nonspecific T wave abnormality When compared with ECG of 18-Apr-2018 15:34, No significant change was found Confirmed by Perla Lye [id number]) on 02/13/2024 4:21:43 PM   PMH:   has a past medical history of Abnormal uterine bleeding, Bursitis, Congestive heart failure (CHF) (HCC), Family history of breast cancer (04/19/2012), GERD (gastroesophageal reflux disease), Hypertension, Increased risk of breast cancer (04/19/2012), and Vitamin D  deficiency.   PSH:    Past Surgical History:  Procedure Laterality Date   CHOLECYSTECTOMY     CONTINUOUS NERVE MONITORING N/A 08/21/2019   Procedure: FACIAL NERVE MONITORING;  Surgeon: Milissa Hamming, MD;  Location: [facility];  Service: ENT;  Laterality: N/A;   PAROTIDECTOMY Left 08/21/2019   Procedure: PAROTIDECTOMY;  Surgeon: Milissa Hamming, MD;  Location: [facility];  Service: ENT;  Laterality: Left;   TUBAL LIGATION      Current Outpatient Medications  Medication Sig Dispense Refill   amLODipine  (NORVASC ) 10 MG tablet Take 1 tablet (10 mg total) by mouth daily. 90 tablet 1   bisoprolol  (ZEBETA ) 5 MG tablet Take 1 tablet (5 mg total) by mouth daily. 30 tablet 2   diclofenac  (VOLTAREN ) 75 MG EC tablet TAKE 1 TABLET(75 MG) BY MOUTH TWICE DAILY 60 tablet 3   ergocalciferol  (DRISDOL ) 1.25 MG (50000 UT) capsule Take one cap q week 12 capsule 3   rosuvastatin  (CRESTOR ) 5 MG tablet Take 1 tablet (5 mg total) by mouth 2 (two) times a week. 30 tablet 3   No current facility-administered medications for this visit.    Allergies:  Patient has no known allergies.   Social History:  The patient  reports that she has never smoked. She has never used smokeless tobacco. She reports that she does not drink alcohol and does not use drugs.   Family History:   family history includes Breast cancer in her mother; Breast cancer (age of onset: 61) in her paternal aunt; Diabetes in her maternal aunt, mother, and paternal aunt; Glaucoma  in her mother; Heart disease in her father; Hypertension in her brother, father, and mother; Osteoporosis in her father and mother.    Review of Systems: Review of Systems  Constitutional: Negative.   HENT: Negative.    Respiratory: Negative.    Cardiovascular: Negative.   Gastrointestinal: Negative.   Musculoskeletal: Negative.   Neurological: Negative.   Psychiatric/Behavioral: Negative.    All other systems reviewed and are negative.   PHYSICAL EXAM: VS:  BP 120/70 (BP Location: Right Arm, Patient Position: Sitting, Cuff Size: Normal)   Pulse 64   Ht 5' 3 (1.6 m)   Wt 164 lb 8 oz (74.6 kg)   SpO2 97%   BMI 29.14 kg/m  , BMI Body mass index is 29.14 kg/m. GEN: Well nourished, well developed, in no acute distress HEENT: normal Neck: no JVD, carotid bruits, or masses Cardiac: RRR; no murmurs, rubs, or gallops,no edema  Respiratory:  clear to auscultation bilaterally, normal work of breathing GI: soft, nontender, nondistended, + BS MS: no deformity or atrophy Skin: warm and dry, no rash Neuro:  Strength and sensation are intact Psych: euthymic mood, full affect  Recent Labs: 12/02/2023: Hemoglobin 12.3; Platelets 330; TSH 2.030 01/04/2024: ALT 50; BUN 12; Creatinine, Ser 1.02; Potassium 4.2; Sodium 143    Lipid Panel Lab Results  Component Value Date   CHOL 232 (H) 12/02/2023   HDL 63 12/02/2023   LDLCALC 152 (H) 12/02/2023   TRIG 97 12/02/2023    Wt Readings from Last 3 Encounters:  02/13/24 164 lb 8 oz (74.6 kg)  01/02/24 167 lb (75.8 kg)  12/12/23 168 lb 6.4 oz (76.4 kg)     ASSESSMENT AND PLAN:  Problem List Items Addressed This Visit       Cardiology Problems   Essential hypertension   Relevant Orders   EKG 12-Lead (Completed)     Other   Palpitations - Primary   Relevant Orders   EKG 12-Lead (Completed)   PVCs Zio monitor showing 3.5% PVC burden, symptomatic -Symptoms improved with bisoprolol  5 mg daily - For breakthrough symptoms, could  take extra bisoprolol  5 mg - Prior echocardiogram with normal ejection fraction, no structural heart disease  SVT Rare short episodes 1 every 2 days 15 seconds or less -Were not flagged on the Zio monitor for symptoms - Continue bisoprolol  5 mg daily as above  Hyperlipidemia Tolerating Crestor  5 mg 2 times a week  Essential hypertension Tolerating amlodipine  10 daily bisoprolol  5 daily, blood pressure well-controlled  Aortic valve regurgitation Trace to mild AI on prior echocardiogram 2022 No further workup needed at this time, no significant murmur on exam   Seen in consultation for Lauren McDonough and will be referred back to her office for ongoing care of the issues detailed above  Signed, Velinda Lunger, M.D., Ph.D. [facility] [place], [place] [phone]

## 2024-02-13 ENCOUNTER — Ambulatory Visit: Attending: Cardiovascular Disease | Admitting: Cardiovascular Disease

## 2024-02-13 ENCOUNTER — Encounter: Payer: Self-pay | Admitting: Cardiovascular Disease

## 2024-02-13 VITALS — BP 120/70 | HR 64 | Ht 63.0 in | Wt 164.5 lb

## 2024-02-13 DIAGNOSIS — R002 Palpitations: Secondary | ICD-10-CM

## 2024-02-13 DIAGNOSIS — I10 Essential (primary) hypertension: Secondary | ICD-10-CM | POA: Diagnosis not present

## 2024-02-13 NOTE — Patient Instructions (Signed)

## 2024-02-22 ENCOUNTER — Telehealth: Payer: Self-pay | Admitting: Physician Assistant

## 2024-02-22 NOTE — Telephone Encounter (Signed)
 Rheumatology appointment 04/30/2024 with [name]

## 2024-02-29 ENCOUNTER — Other Ambulatory Visit: Payer: Self-pay | Admitting: Lab

## 2024-02-29 MED ORDER — DICLOFENAC SODIUM 75 MG PO TBEC: 75.0000 mg | DELAYED_RELEASE_TABLET | Freq: Two times a day (BID) | ORAL | 0 refills | Status: AC

## 2024-03-07 ENCOUNTER — Other Ambulatory Visit: Payer: Self-pay

## 2024-03-07 DIAGNOSIS — R002 Palpitations: Secondary | ICD-10-CM

## 2024-03-07 DIAGNOSIS — I10 Essential (primary) hypertension: Secondary | ICD-10-CM

## 2024-03-07 MED ORDER — BISOPROLOL FUMARATE 5 MG PO TABS: 5.0000 mg | ORAL_TABLET | Freq: Every day | ORAL | 1 refills | Status: AC

## 2024-03-07 MED ORDER — AMLODIPINE BESYLATE 10 MG PO TABS: 10.0000 mg | ORAL_TABLET | Freq: Every day | ORAL | 1 refills | Status: AC

## 2024-03-26 ENCOUNTER — Encounter: Payer: Managed Care, Other (non HMO) | Admitting: Physician Assistant
